# Patient Record
Sex: Female | Born: 1965 | Race: White | Hispanic: No | Marital: Married | Smoking: Former smoker
Health system: Southern US, Community
[De-identification: ages and names within clinical notes are randomized; demographics above are authoritative.]

## PROBLEM LIST (undated history)

## (undated) DIAGNOSIS — E785 Hyperlipidemia, unspecified: Secondary | ICD-10-CM

## (undated) DIAGNOSIS — Z720 Tobacco use: Secondary | ICD-10-CM

## (undated) DIAGNOSIS — T7840XA Allergy, unspecified, initial encounter: Secondary | ICD-10-CM

## (undated) DIAGNOSIS — IMO0002 Reserved for concepts with insufficient information to code with codable children: Secondary | ICD-10-CM

## (undated) DIAGNOSIS — R0789 Other chest pain: Secondary | ICD-10-CM

## (undated) DIAGNOSIS — D649 Anemia, unspecified: Secondary | ICD-10-CM

## (undated) HISTORY — DX: Anemia, unspecified: D64.9

## (undated) HISTORY — DX: Tobacco use: Z72.0

## (undated) HISTORY — DX: Reserved for concepts with insufficient information to code with codable children: IMO0002

## (undated) HISTORY — DX: Allergy, unspecified, initial encounter: T78.40XA

## (undated) HISTORY — DX: Other chest pain: R07.89

## (undated) HISTORY — DX: Hyperlipidemia, unspecified: E78.5

---

## 2001-05-29 ENCOUNTER — Emergency Department (HOSPITAL_COMMUNITY): Admission: EM | Admit: 2001-05-29 | Discharge: 2001-05-29 | Payer: Self-pay

## 2001-08-20 ENCOUNTER — Other Ambulatory Visit: Admission: RE | Admit: 2001-08-20 | Discharge: 2001-08-20 | Payer: Self-pay | Admitting: Obstetrics & Gynecology

## 2002-03-12 ENCOUNTER — Emergency Department (HOSPITAL_COMMUNITY): Admission: EM | Admit: 2002-03-12 | Discharge: 2002-03-12 | Payer: Self-pay | Admitting: Emergency Medicine

## 2007-10-07 HISTORY — PX: OTHER SURGICAL HISTORY: SHX169

## 2007-10-09 ENCOUNTER — Inpatient Hospital Stay (HOSPITAL_COMMUNITY): Admission: EM | Admit: 2007-10-09 | Discharge: 2007-10-10 | Payer: Self-pay | Admitting: Emergency Medicine

## 2009-06-24 ENCOUNTER — Emergency Department (HOSPITAL_COMMUNITY): Admission: EM | Admit: 2009-06-24 | Discharge: 2009-06-25 | Payer: Self-pay | Admitting: Emergency Medicine

## 2009-08-20 ENCOUNTER — Ambulatory Visit: Payer: Self-pay | Admitting: Internal Medicine

## 2009-08-20 ENCOUNTER — Observation Stay (HOSPITAL_COMMUNITY): Admission: EM | Admit: 2009-08-20 | Discharge: 2009-08-21 | Payer: Self-pay | Admitting: Emergency Medicine

## 2009-09-05 DIAGNOSIS — R0789 Other chest pain: Secondary | ICD-10-CM

## 2009-09-05 HISTORY — DX: Other chest pain: R07.89

## 2009-09-07 ENCOUNTER — Encounter (INDEPENDENT_AMBULATORY_CARE_PROVIDER_SITE_OTHER): Payer: Self-pay | Admitting: Internal Medicine

## 2009-09-07 ENCOUNTER — Ambulatory Visit: Payer: Self-pay | Admitting: Internal Medicine

## 2009-09-07 DIAGNOSIS — F17200 Nicotine dependence, unspecified, uncomplicated: Secondary | ICD-10-CM | POA: Insufficient documentation

## 2009-09-07 DIAGNOSIS — L259 Unspecified contact dermatitis, unspecified cause: Secondary | ICD-10-CM | POA: Insufficient documentation

## 2009-09-07 DIAGNOSIS — R0789 Other chest pain: Secondary | ICD-10-CM | POA: Insufficient documentation

## 2009-09-07 DIAGNOSIS — E785 Hyperlipidemia, unspecified: Secondary | ICD-10-CM | POA: Insufficient documentation

## 2009-09-07 LAB — HM MAMMOGRAPHY

## 2010-10-05 NOTE — Assessment & Plan Note (Signed)
Summary: HFU/PER SIDHU/CH   Vital Signs:  Patient profile:   45 year old female Height:      63 inches (160.02 cm) Weight:      144.1 pounds (65.50 kg) BMI:     25.62 Temp:     97.6 degrees F (36.44 degrees C) oral Pulse rate:   77 / minute BP sitting:   131 / 80  (right arm)  Vitals Entered By: Stanton Kidney Ditzler RN September 27, 2009 3:34 PM)  Nutrition Counseling: Patient's BMI is greater than 25 and therefore counseled on weight management options. Is Patient Diabetic? No Pain Assessment Patient in pain? no      Nutritional Status BMI of 25 - 29 = overweight Nutritional Status Detail appetite good  Have you ever been in a relationship where you felt threatened, hurt or afraid?denies   Does patient need assistance? Functional Status Self care Ambulation Normal Comments New HFU - needs refill on stop smoking prefers 1 box. From [place] - has brother dying from ca in [place].   Visit Type:  hopsital follow up Primary Care Provider:  Elby Showers MD   History of Present Illness: [name] is a 45 year old woman with no significant past medical history aside from family history positive for early MI and tobacco use who presented with chest pain recently and was admiited for 1 day in the hospital comes in [place] for a hospital follow up. No c/o of any chest pain while running or hurrying for things. She complains of intermittent rash on her extremities which comes and goes. 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 No other complaints at this time.   Depression History:      The patient denies a depressed mood most of the day and a diminished interest in her usual daily activities.         Preventive Screening-Counseling & Management  Alcohol-Tobacco     Alcohol drinks/day: 0     Smoking Status: quit     Smoking Cessation Counseling: 09/05/09  Caffeine-Diet-Exercise     Diet Counseling: not indicated; diet is assessed to be  healthy     Does Patient Exercise: no  Problems Prior to Update: None  Medications Prior to Update: 1)  None  Current Medications (verified): 1)  Chantix Starting Month Pak 0.5 Mg X 11 & 1 Mg X 42 Tabs (Varenicline Tartrate) .... Take As Instructed 2)  Hydrocortisone 1 % Crea (Hydrocortisone) .... Apply To Affected Area 3 Times A Day  Allergies (verified): No Known Drug Allergies  Past History:  Family History: Last updated: Sep 27, 2009 father died of unknown cancer mothet is still alive at 78 with breast cancer. brother diagnose dwith lung cancer recently  Social History: Last updated: 09/27/09 live with your husband works as an Sales executive Former Smoker  Risk Factors: Alcohol Use: 0 (September 27, 2009) Exercise: no (27-Sep-2009)  Risk Factors: Smoking Status: quit (2009-09-27)  Past Medical History: degenerative disc disease tobacco abuse  Past Surgical History: left arm fracture and repaired with oRIF  Family History: Reviewed history and no changes required. father died of unknown cancer mothet is still alive at 23 with breast cancer. brother diagnose dwith lung cancer recently  Social History: Reviewed history and no changes required. 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    Impression & Recommendations:  Problem # 1:  CHEST PAIN, ATYPICAL (ICD-786.59) She does not complain of any new chest pains. Her chest pain is not related to cardiac origin as its not reproducible  with exertion. Risk startification with smoking cessation and lipid management with goal LDL of 130 will be started. No further cardiac workup required at this time.  Problem # 2:  TOBACCO ABUSE (ICD-305.1) She is determined to stop smoking. I congratulated her and gave her a box of chantix to help with cessation. Her updated medication list for this problem includes:    Chantix Starting Month Pak 0.5 Mg X 11 & 1 Mg X 42 Tabs (Varenicline tartrate) .Marland Kitchen... Take as instructed  Problem # 3:  CONTACT DERMATITIS&OTHER ECZEMA DUE UNSPEC CAUSE (ICD-692.9) Patient complains of intermittent rash with itching. This could be 2/2 cat dander or chemicals at work that she is exposed to. I counselled her about avoiding all these possible triggers and use topical steroid cream if rash reappears. Her updated medication list for this problem includes:    Hydrocortisone 1 % Crea (Hydrocortisone) .Marland Kitchen... Apply to affected area 3 times a day  Problem # 4:  Preventive Health Care (ICD-V70.0) Deferred at this time for next month with a follow up appointment with PCP.  Complete Medication List: 1)  Chantix Starting Month Pak 0.5 Mg X 11 & 1 Mg X 42 Tabs (Varenicline tartrate) .... Take as instructed 2)  Hydrocortisone 1 % Crea (Hydrocortisone) .... Apply to affected area 3 times a day  Patient Instructions: 1)  Please schedule a follow-up appointment in 1 month. 2)  It is important that you exercise regularly at least 20 minutes 5 times a week. If you develop chest pain, have severe difficulty breathing, or feel very tired , stop exercising immediately and seek medical attention. 3)  You need to lose weight. Consider a lower calorie diet and regular exercise.  4)  Schedule your mammogram. 5)  Schedule a colonoscopy/sigmoidoscopy to help detect colon cancer. 6)  You need to have a Pap Smear to prevent cervical cancer. Prescriptions: HYDROCORTISONE 1 % CREA (HYDROCORTISONE) apply to affected area 3 times a day  #1 x  1   Entered and Authorized by:   Lars Mage MD   Signed by:   Lars Mage MD on 09/07/2009   Method used:   Electronically to        [facility] [address] [phone]* (retail)       [address]       [place], [place]  [address]       Ph: [phone] or [phone]       Fax: [phone]   RxID:   [id number] CHANTIX STARTING MONTH PAK 0.5 MG X 11 & 1 MG X 42 TABS (VARENICLINE TARTRATE) take as instructed  #1 pack x 1   Entered and Authorized by:   Lars Mage MD   Signed by:   Lars Mage MD on 09/07/2009   Method used:   [facility]ically to        [facility] [address] [phone]* (retail)  [address]       [place], [place]  [address]       Ph: [phone] or [phone]       Fax: [phone]   RxID:   [id number]    Prevention & Chronic Care Immunizations   Influenza vaccine: Not documented    Tetanus booster: Not documented   Td booster deferral: Deferred  (09/07/2009)    Pneumococcal vaccine: Not documented  Other Screening   Pap smear: Not documented   Pap smear action/deferral: Deferred  (09/07/2009)    Mammogram: Not documented   Mammogram action/deferral: Deferred  (09/07/2009)   Smoking status: quit  (09/07/2009)  Lipids   Total Cholesterol: Not documented   Lipid panel action/deferral: Not indicated   LDL: Not documented   LDL Direct: Not documented   HDL: Not documented   Triglycerides: Not documented   Lipid panel due: 11/18/2009    SGOT (AST): Not documented   SGPT (ALT): Not documented   Alkaline phosphatase: Not documented   Total bilirubin: Not documented    Lipid flowsheet reviewed?: Yes   Progress toward LDL goal: At goal  Self-Management Support :   Personal Goals (by the next clinic visit) :      Personal LDL goal: 130  (09/07/2009)    Lipid self-management support: Not documented     Lipid self-management support not done because: Good outcomes  (09/07/2009)   Nursing  Instructions: Give Flu vaccine today

## 2010-10-05 NOTE — Miscellaneous (Signed)
Summary: HIPAA Restrictions  HIPAA Restrictions   Imported By: Florinda Marker 09/07/2009 16:32:10  _____________________________________________________________________  External Attachment:    Type:   Image     Comment:   External Document

## 2010-11-09 ENCOUNTER — Encounter: Payer: Self-pay | Admitting: Ophthalmology

## 2010-12-06 LAB — HEPATIC FUNCTION PANEL
Alkaline Phosphatase: 58 U/L (ref 39–117)
Bilirubin, Direct: <0.1 mg/dL (ref 0.0–0.3)
Total Protein: 6.2 g/dL (ref 6.0–8.3)

## 2010-12-06 LAB — CBC
HCT: 38.8 % (ref 36.0–46.0)
MCHC: 35.4 g/dL (ref 30.0–36.0)
MCV: 93.5 fL (ref 78.0–100.0)
Platelets: 416 10*3/uL — ABNORMAL HIGH (ref 150–400)

## 2010-12-06 LAB — POCT CARDIAC MARKERS
CKMB, poc: <1 ng/mL — ABNORMAL LOW (ref 1.0–8.0)
CKMB, poc: <1 ng/mL — ABNORMAL LOW (ref 1.0–8.0)
Myoglobin, poc: 53.6 ng/mL (ref 12–200)

## 2010-12-06 LAB — BASIC METABOLIC PANEL
BUN: 13 mg/dL (ref 6–23)
BUN: 9 mg/dL (ref 6–23)
CO2: 24 mEq/L (ref 19–32)
Calcium: 8.9 mg/dL (ref 8.4–10.5)
Calcium: 9.2 mg/dL (ref 8.4–10.5)
Creatinine, Ser: 0.58 mg/dL (ref 0.4–1.2)
Creatinine, Ser: 0.68 mg/dL (ref 0.4–1.2)
GFR calc non Af Amer: >60 mL/min (ref >60)
Glucose, Bld: 118 mg/dL — ABNORMAL HIGH (ref 70–99)
Glucose, Bld: 93 mg/dL (ref 70–99)
Potassium: 4.1 mEq/L (ref 3.5–5.1)

## 2010-12-06 LAB — DIFFERENTIAL
Basophils Relative: 1 % (ref 0–1)
Eosinophils Absolute: 0.2 10*3/uL (ref 0.0–0.7)
Eosinophils Relative: 2 % (ref 0–5)
Neutrophils Relative %: 52 % (ref 43–77)

## 2010-12-06 LAB — CARDIAC PANEL(CRET KIN+CKTOT+MB+TROPI)
CK, MB: 0.7 ng/mL (ref 0.3–4.0)
CK, MB: 0.7 ng/mL (ref 0.3–4.0)
Total CK: 39 U/L (ref 7–177)

## 2010-12-06 LAB — LIPID PANEL

## 2010-12-06 LAB — TROPONIN I

## 2010-12-06 LAB — CK TOTAL AND CKMB (NOT AT ARMC)
CK, MB: 0.6 ng/mL (ref 0.3–4.0)
Total CK: 34 U/L (ref 7–177)

## 2010-12-06 LAB — HEMOGLOBIN A1C

## 2011-01-18 NOTE — Op Note (Signed)
[name], [name]            ACCOUNT NO.:  [id number]   MEDICAL RECORD NO.:  [id number]          PATIENT TYPE:  OBV   LOCATION:  5015                         FACILITY:  [facility]   PHYSICIAN:  Wanda Ano. Gramig III, M.D.DATE OF BIRTH:  August 13, 1966   DATE OF PROCEDURE:  10/09/2007  DATE OF DISCHARGE:                               OPERATIVE REPORT   PREOPERATIVE DIAGNOSIS:  Elbow fracture subluxation with comminuted  radial head fracture and lateral collateral ligament insufficiency.   POSTOPERATIVE DIAGNOSIS:  Elbow fracture subluxation with comminuted  radial head fracture and lateral collateral ligament insufficiency with  associated capitellum loose body (osteochondral fracture of the  capitellum).   SURGICAL PROCEDURES:  1. Open reduction internal fixation radial head fracture with      reduction of the subluxation.  2. Lateral ulnar collateral ligament repair with FiberWire technique      left elbow.  3. Loose body removal secondary to osteochondral fracture about the      capitellum with capitellum debridement.  4. Stress radiography.   SURGEON:  [name], M.D.   ASSISTANT:  Wanda Chimera, PA   COMPLICATIONS:  None.   ANESTHESIA:  General.   TOURNIQUET TIME:  Less than an hour.   ESTIMATED BLOOD LOSS:  Minimal.   INDICATIONS FOR PROCEDURE:  This patient is a 45 year old female who  presents with above mentioned diagnosis.  I have counseled her in regard  to risks benefits surgery including risk of infection, bleeding,  anesthesia, damage to normal structures and failure of surgery to  accomplish its intended goals of relieving symptoms and restoring  function.  With this in mind, she desires to proceed.  All questions  encouraged answered preoperatively.   OPERATIVE PROCEDURE:  The patient seen by myself and anesthesia taken to  the operative suite, underwent smooth induction of IV sedation and  infraclavicular block placed by Dr. Isidor Kelly in  the holding area  had excellent anesthetic abilities.  The patient had the permit signed,  time out called and arm marked prior to procedure.  Once she was prepped  and draped usual sterile fashion a lateral incision was made about the  elbow.  Dissection was carried down and interval between the anconeus  and extensor carpi ulnaris was accomplished.  The lateral collateral  ligament was injured and there was an osteochondral fracture as well as  comminuted radial head fracture.  I irrigated the joint, cleaned out the  hematoma and checked her stability and findings.  Subluxation was  reduced.  The radial head then underwent reduction and reconstruction  with AccuTrack screws of the micro variety.  This allowed for excellent  recreation of the joint and I was pleased with the ORIF the radial head  and its subsequent and reduction of the subluxation.   Following this, I removed the loose body from the capitellum.  This was  an osteochondral shear fracture and was removed without difficulty and  debrided.  Following this, I irrigated the area copiously once again and  turned attention towards the ligamentous architecture.   The lateral collateral ligament was repaired with FiberWire technique.  This done without difficulty imbricated it nicely.  The patient had  excellent stability, full range of motion, no complicating features.   Following this, I performed stress radiography revealing excellent  position on the AP and lateral planes, also performed stress radiography  of the wrist which did not show any fracture.  The scaphoid and  scapholunate interval looked well.  I was pleased with this and the  findings.  Following this I irrigated copiously, closed the fascia with  0-0 Vicryl, subcu with 3-0 Vicryl.  The skin edge with 3-0 Prolene.  The  patient tolerated this well.  Marcaine 0.25% with epinephrine was placed  for postop analgesia and she was placed a long-arm splint neutral   position.  She will be taken to recovery room and will plan for IV  antibiotics, general postop observation, IV pain medicine etc. as  necessary.  All questions have been encouraged answered.           ______________________________  [name], M.D.     [name]  D:  10/09/2007  T:  10/10/2007  Job:  [id number]

## 2011-05-27 LAB — BASIC METABOLIC PANEL
CO2: 22
Calcium: 9.4
Chloride: 106
Glucose, Bld: 119 — ABNORMAL HIGH
Potassium: 3.9
Sodium: 136

## 2011-05-27 LAB — DIFFERENTIAL
Basophils Relative: 0
Eosinophils Absolute: 0.1
Lymphs Abs: 1.6
Monocytes Absolute: 0.6
Monocytes Relative: 6
Neutrophils Relative %: 78 — ABNORMAL HIGH

## 2011-05-27 LAB — CBC
Hemoglobin: 12.8
MCHC: 34.8
MCV: 91.6
RBC: 4.02
WBC: 10.8 — ABNORMAL HIGH

## 2011-10-19 ENCOUNTER — Ambulatory Visit (INDEPENDENT_AMBULATORY_CARE_PROVIDER_SITE_OTHER): Payer: 59 | Admitting: Emergency Medicine

## 2011-10-19 ENCOUNTER — Ambulatory Visit: Payer: 59

## 2011-10-19 VITALS — BP 167/98 | HR 79 | Temp 98.1°F | Resp 18 | Ht 62.0 in | Wt 181.2 lb

## 2011-10-19 DIAGNOSIS — M549 Dorsalgia, unspecified: Secondary | ICD-10-CM

## 2011-10-19 DIAGNOSIS — N920 Excessive and frequent menstruation with regular cycle: Secondary | ICD-10-CM

## 2011-10-19 DIAGNOSIS — R05 Cough: Secondary | ICD-10-CM

## 2011-10-19 DIAGNOSIS — R059 Cough, unspecified: Secondary | ICD-10-CM

## 2011-10-19 DIAGNOSIS — J40 Bronchitis, not specified as acute or chronic: Secondary | ICD-10-CM

## 2011-10-19 DIAGNOSIS — D649 Anemia, unspecified: Secondary | ICD-10-CM

## 2011-10-19 DIAGNOSIS — R509 Fever, unspecified: Secondary | ICD-10-CM

## 2011-10-19 LAB — POCT CBC
Granulocyte percent: 57.1 %G (ref 37–80)
Hemoglobin: 10.5 g/dL — AB (ref 12.2–16.2)
Lymph, poc: 2.5 (ref 0.6–3.4)
MCHC: 32.2 g/dL (ref 31.8–35.4)
MPV: 10.1 fL (ref 0–99.8)
POC Granulocyte: 4 (ref 2–6.9)
POC LYMPH PERCENT: 35.6 %L (ref 10–50)
POC MID %: 7.3 %M (ref 0–12)
RDW, POC: 32.2 %

## 2011-10-19 MED ORDER — BENZONATATE 200 MG PO CAPS: 200.0000 mg | ORAL_CAPSULE | Freq: Three times a day (TID) | ORAL | Status: AC | PRN

## 2011-10-19 MED ORDER — AZITHROMYCIN 250 MG PO TABS: ORAL_TABLET | ORAL | Status: AC

## 2011-10-19 NOTE — Progress Notes (Signed)
  Subjective:    Patient ID: [name], female    DOB: 1965-10-07, 46 y.o.   MRN: [id number]  HPI patient enters with about 3-4 day history of cough he has had discomfort in her lower chest posteriorly on both sides. She has had a cough which has been productive of small amounts of phlegm. She is unsure whether she has had a fever appear   Review of Systems patient denies chest pain. She has no history of bronchitis or previous lung disease. She quit smoking many many years ago.     Objective:   Physical Exam physical exam her HEENT exam is within normal limits. Her chest was clear to auscultation her heart was regular rate no murmurs her abdomen was soft without tenderness.  [facility] reading (PRIMARY) by  Dr. Cleta Alberts NAD.        Assessment & Plan:  Patient has an acute bronchitis. She also was found to be anemic and will need to refer her to GYN for menorrhagia for that problem. We'll treat with Tessalon Perles and a Z-Pak and see if we can improve her symptoms. I gave her a work note to leave her out of work the rest of the week and return to work next Monday.

## 2011-10-24 ENCOUNTER — Encounter: Payer: Self-pay | Admitting: Gynecology

## 2011-10-24 ENCOUNTER — Ambulatory Visit (INDEPENDENT_AMBULATORY_CARE_PROVIDER_SITE_OTHER): Payer: Self-pay | Admitting: Gynecology

## 2011-10-24 ENCOUNTER — Other Ambulatory Visit (HOSPITAL_COMMUNITY)
Admission: RE | Admit: 2011-10-24 | Discharge: 2011-10-24 | Disposition: A | Discharge status: Home / Self Care | Payer: Self-pay | Source: Ambulatory Visit | Attending: Gynecology | Admitting: Gynecology

## 2011-10-24 VITALS — BP 146/92 | Ht 62.0 in | Wt 180.0 lb

## 2011-10-24 DIAGNOSIS — Z01419 Encounter for gynecological examination (general) (routine) without abnormal findings: Secondary | ICD-10-CM | POA: Insufficient documentation

## 2011-10-24 DIAGNOSIS — Z833 Family history of diabetes mellitus: Secondary | ICD-10-CM

## 2011-10-24 DIAGNOSIS — N979 Female infertility, unspecified: Secondary | ICD-10-CM | POA: Insufficient documentation

## 2011-10-24 LAB — URINALYSIS W MICROSCOPIC + REFLEX CULTURE
Bilirubin Urine: NEGATIVE
Glucose, UA: NEGATIVE mg/dL
Hgb urine dipstick: NEGATIVE
Leukocytes, UA: NEGATIVE
pH: 5 (ref 5.0–8.0)

## 2011-10-24 LAB — CBC WITH DIFFERENTIAL/PLATELET
Eosinophils Relative: 2 % (ref 0–5)
HCT: 39.3 % (ref 36.0–46.0)
Hemoglobin: 13.1 g/dL (ref 12.0–15.0)
Lymphocytes Relative: 33 % (ref 12–46)
MCHC: 33.3 g/dL (ref 30.0–36.0)
MCV: 91.8 fL (ref 78.0–100.0)
Monocytes Absolute: 0.5 10*3/uL (ref 0.1–1.0)
Monocytes Relative: 6 % (ref 3–12)
Neutro Abs: 5 10*3/uL (ref 1.7–7.7)

## 2011-10-24 LAB — GLUCOSE, RANDOM: Glucose, Bld: 96 mg/dL (ref 70–99)

## 2011-10-24 NOTE — Progress Notes (Signed)
[name] Mar 22, 1966 [id number]   History:    46 y.o.  for annual exam who is a gravida 1 para 0 AB 1 who has not had any gynecological exam several years. She has no primary physician. She was evaluated in the emergency room 2010 for chest pain. Patient is asymptomatic today. Not using any form of contraception. 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 Patient denies minimal infertility evaluation and the past. No prior mammograms done. Patient does her monthly self breast examination. Regular menstrual cycles are reported although sometimes heavy but have been regular lasting 3 days.  Past medical history,surgical history, family history and social history were all reviewed and documented in the EPIC chart.  Gynecologic History Patient's last menstrual period was 10/19/2011. Contraception: none Last Pap: ?Marland Kitchen Results were: normal Last mammogram:  No prior study. Results were: no prior study  Obstetric History OB History    Grav Para Term Preterm Abortions TAB SAB Ect Mult Living                   ROS:  Was performed and pertinent positives and negatives are included in the history.  Exam: chaperone present  BP 146/92  Ht 5\' 2"  (1.575 m)  Wt 180 lb (81.647 kg)  BMI 32.92 kg/m2  LMP 10/19/2011  Body mass index is 32.92 kg/(m^2).  General appearance : Well developed well nourished female. No acute distress HEENT: Neck supple, trachea midline, no carotid bruits, no thyroidmegaly Lungs: Clear to auscultation, no rhonchi or wheezes, or rib retractions  Heart: Regular rate and rhythm, no murmurs or gallops Breast:Examined in sitting and supine position were symmetrical in appearance, no palpable masses or tenderness,  no skin retraction, no nipple inversion, no nipple discharge, no skin discoloration, no axillary or supraclavicular lymphadenopathy Abdomen: no palpable masses or tenderness, no rebound or guarding Extremities: no edema or skin discoloration or  tenderness  Pelvic:  Bartholin, Urethra, Skene Glands: Within normal limits             Vagina: No gross lesions or discharge  Cervix: No gross lesions or discharge  Uterus  anteverted, normal size, shape and consistency, non-tender and mobile  Adnexa  Without masses or tenderness  Anus and perineum  normal   Rectovaginal  normal sphincter tone without palpated masses or tenderness             Hemoccult  not done     Assessment/Plan:  46 y.o. female for annual exam  with no complaints. Repeat blood pressure 130/90. Patient be given a requisition to schedule her mammogram. We're going to ask her to maintain a blood pressure log at home and submit to the office after a week of readings. She will have the following labs drawn today: CBC, total cholesterol, random blood sugar, urinalysis along with her Pap smear. We did discuss her secondary infertility. Patient not interested in proceeding with any additional testing. Patient instructed take calcium and vitamin D for osteoporosis prevention.   Ok Edwards MD, 2:56 PM 10/24/2011

## 2011-10-24 NOTE — Patient Instructions (Signed)
I would recommend that you purchase in the pharmacy and ovulation predictor kit and use it from day 12 through 16 of her cycle. If the strip shows a change this would mean that she would be ovulating in the next 24 hours and this would be the time to have intercourse. I would also recommend that she takes daily prenatal vitamin that she can buy over-the-counter. Remember to schedule mammogram.

## 2012-01-09 ENCOUNTER — Encounter: Payer: Self-pay | Admitting: *Deleted

## 2012-01-12 ENCOUNTER — Other Ambulatory Visit: Payer: Self-pay | Admitting: *Deleted

## 2012-01-12 DIAGNOSIS — D691 Qualitative platelet defects: Secondary | ICD-10-CM

## 2012-01-12 DIAGNOSIS — E7800 Pure hypercholesterolemia, unspecified: Secondary | ICD-10-CM

## 2012-01-17 ENCOUNTER — Other Ambulatory Visit: Payer: 59

## 2012-01-17 DIAGNOSIS — D691 Qualitative platelet defects: Secondary | ICD-10-CM

## 2012-01-17 DIAGNOSIS — E7800 Pure hypercholesterolemia, unspecified: Secondary | ICD-10-CM

## 2012-01-17 LAB — CBC WITH DIFFERENTIAL/PLATELET
Basophils Absolute: 0 10*3/uL (ref 0.0–0.1)
Basophils Relative: 0 % (ref 0–1)
Eosinophils Absolute: 0.1 10*3/uL (ref 0.0–0.7)
Eosinophils Relative: 2 % (ref 0–5)
MCH: 30.5 pg (ref 26.0–34.0)
MCHC: 33.2 g/dL (ref 30.0–36.0)
MCV: 91.8 fL (ref 78.0–100.0)
Platelets: 387 10*3/uL (ref 150–400)
RDW: 12.3 % (ref 11.5–15.5)

## 2012-01-17 LAB — LIPID PANEL
Cholesterol: 240 mg/dL — ABNORMAL HIGH (ref 0–200)
HDL: 41 mg/dL (ref >39)
Total CHOL/HDL Ratio: 5.9 Ratio

## 2013-07-15 ENCOUNTER — Encounter (HOSPITAL_COMMUNITY): Payer: Self-pay | Admitting: Emergency Medicine

## 2013-07-15 ENCOUNTER — Emergency Department (HOSPITAL_COMMUNITY): Payer: 59

## 2013-07-15 ENCOUNTER — Emergency Department (HOSPITAL_COMMUNITY)
Admission: EM | Admit: 2013-07-15 | Discharge: 2013-07-16 | Disposition: A | Discharge status: Home / Self Care | Payer: 59 | Attending: Emergency Medicine | Admitting: Emergency Medicine

## 2013-07-15 DIAGNOSIS — Z8679 Personal history of other diseases of the circulatory system: Secondary | ICD-10-CM | POA: Insufficient documentation

## 2013-07-15 DIAGNOSIS — J069 Acute upper respiratory infection, unspecified: Secondary | ICD-10-CM

## 2013-07-15 DIAGNOSIS — B9789 Other viral agents as the cause of diseases classified elsewhere: Secondary | ICD-10-CM | POA: Insufficient documentation

## 2013-07-15 DIAGNOSIS — Z79899 Other long term (current) drug therapy: Secondary | ICD-10-CM | POA: Insufficient documentation

## 2013-07-15 DIAGNOSIS — H9209 Otalgia, unspecified ear: Secondary | ICD-10-CM | POA: Insufficient documentation

## 2013-07-15 DIAGNOSIS — Z87891 Personal history of nicotine dependence: Secondary | ICD-10-CM | POA: Insufficient documentation

## 2013-07-15 DIAGNOSIS — IMO0002 Reserved for concepts with insufficient information to code with codable children: Secondary | ICD-10-CM | POA: Insufficient documentation

## 2013-07-15 DIAGNOSIS — B349 Viral infection, unspecified: Secondary | ICD-10-CM

## 2013-07-15 DIAGNOSIS — R0602 Shortness of breath: Secondary | ICD-10-CM | POA: Insufficient documentation

## 2013-07-15 DIAGNOSIS — E785 Hyperlipidemia, unspecified: Secondary | ICD-10-CM | POA: Insufficient documentation

## 2013-07-15 DIAGNOSIS — R110 Nausea: Secondary | ICD-10-CM | POA: Insufficient documentation

## 2013-07-15 DIAGNOSIS — R6883 Chills (without fever): Secondary | ICD-10-CM | POA: Insufficient documentation

## 2013-07-15 NOTE — ED Notes (Signed)
Pt states URI like smyptoms for the past month. Pt states dry cough with intermittant productivity. Pt states that she has had stuffy nose on right side and nausea. Pt ambulatory, no respiratory distress.

## 2013-07-16 MED ORDER — ALBUTEROL SULFATE HFA 108 (90 BASE) MCG/ACT IN AERS
2.0000 | INHALATION_SPRAY | Freq: Once | RESPIRATORY_TRACT | Status: AC
  Administered 2013-07-16: 2 via RESPIRATORY_TRACT
  Filled 2013-07-16: qty 6.7

## 2013-07-16 NOTE — ED Provider Notes (Signed)
CSN: [id number]     Arrival date & time 07/15/13  2111 History   First MD Initiated Contact with Patient 07/15/13 2212     Chief Complaint  Patient presents with  . URI   (Consider location/radiation/quality/duration/timing/severity/associated sxs/prior Treatment) The history is provided by the patient. No language interpreter was used.  [name] is a 47 year old female with past medical history of hyperlipidemia, atypical chest pain, degenerative disc disease presenting to emergency department with sinus pressure, cough, nasal congestion. Patient reports that the sinus pressure discomfort has been ongoing for the past month. Patient reports that the cough has been ongoing for the past month. Patient reports that she's been developing phlegm with a yellow discoloration. Patient reports she's been experiencing right ear pain described as a dull aching sensation. Patient reports that every once in a while she gets shortness of breath-reported that this is nothing new and that this is been ongoing for the past couple of years. Patient reports that her sinus discomfort improves with hot compressions to the sinus regions. Discussed that she has been using Claritin as needed for relief. Denied sore throat, fever, chills, vomiting, diarrhea, abdominal pain, weakness, changes to eating, sore throat, difficulty swallowing, urinary issues. PCP none  Past Medical History  Diagnosis Date  . Degenerative disk disease     No radiologic evicence in EMR  . Tobacco abuse   . Hyperlipidemia   . Atypical chest pain 1/11    Pain is not reproducible with exertion.    Past Surgical History  Procedure Laterality Date  . Other orif  2/09     Fracture of the radial head with  intraarticular extension and joint.   Family History  Problem Relation Age of Onset  . Breast cancer Mother   . Cancer Father     Unknown type.  . Cancer Brother     LUNG   History  Substance Use Topics  . Smoking  status: Former Smoker    Quit date: 10/23/2009  . Smokeless tobacco: Never Used  . Alcohol Use: Yes     Comment: RARE   OB History   Grav Para Term Preterm Abortions TAB SAB Ect Mult Living                 Review of Systems  Constitutional: Positive for chills. Negative for fever.  HENT: Positive for congestion and sinus pressure. Negative for trouble swallowing.   Eyes: Negative for visual disturbance.  Respiratory: Positive for cough and shortness of breath. Negative for chest tightness.   Cardiovascular: Negative for chest pain.  Gastrointestinal: Positive for nausea. Negative for vomiting and abdominal pain.  Musculoskeletal: Negative for back pain.  Neurological: Negative for dizziness, weakness and headaches.  All other systems reviewed and are negative.    Allergies  Milk-related compounds  Home Medications   Current Outpatient Rx  Name  Route  Sig  Dispense  Refill  . ibuprofen (ADVIL,MOTRIN) 200 MG tablet   Oral   Take 600-800 mg by mouth daily as needed (pain).         Marland Kitchen loratadine (CLARITIN) 10 MG tablet   Oral   Take 10 mg by mouth daily.         Marland Kitchen triamcinolone (NASACORT ALLERGY 24HR) 55 MCG/ACT AERO nasal inhaler   Nasal   Place 2 sprays into the nose daily as needed (congestion).          BP 176/89  Pulse 88  Temp(Src) 98.1 F (36.7 C) (Oral)  Resp 16  Ht 5\' 3"  (1.6 m)  Wt 189 lb 9 oz (85.985 kg)  BMI 33.59 kg/m2  SpO2 97%  LMP 06/26/2013 Physical Exam  Nursing note and vitals reviewed. Constitutional: She is oriented to person, place, and time. She appears well-developed and well-nourished. No distress.  HENT:  Head: Normocephalic and atraumatic.  Right Ear: External ear normal.  Left Ear: External ear normal.  Mouth/Throat: Oropharynx is clear and moist. No oropharyngeal exudate.  Eyes: Conjunctivae and EOM are normal. Pupils are equal, round, and reactive to light. Right eye exhibits no discharge. Left eye exhibits no discharge.   Neck: Normal range of motion. Neck supple.  Cardiovascular: Normal rate, regular rhythm and normal heart sounds.  Exam reveals no friction rub.   No murmur heard. Pulses:      Radial pulses are 2+ on the right side, and 2+ on the left side.  Pulmonary/Chest: Effort normal and breath sounds normal. No respiratory distress. She has no wheezes. She has no rales.  Neurological: She is alert and oriented to person, place, and time.  Skin: Skin is warm and dry. No rash noted. She is not diaphoretic. No erythema.  Psychiatric: She has a normal mood and affect. Her behavior is normal. Thought content normal.    ED Course  Procedures (including critical care time) Labs Review Labs Reviewed - No data to display Imaging Review Dg Chest 2 View  07/15/2013   CLINICAL DATA:  Cough and shortness of breath.  EXAM: CHEST  2 VIEW  COMPARISON:  10/19/2011.  FINDINGS: Normal sized heart. Clear lungs. Mild thoracic spine degenerative changes.  IMPRESSION: No acute abnormality.   Electronically Signed   By: Gordan Payment M.D.   On: 07/15/2013 23:23    EKG Interpretation   None       MDM   1. URI (upper respiratory infection)   2. Viral illness    Filed Vitals:   07/15/13 2119  BP: 176/89  Pulse: 88  Temp: 98.1 F (36.7 C)  TempSrc: Oral  Resp: 16  Height: 5\' 3"  (1.6 m)  Weight: 189 lb 9 oz (85.985 kg)  SpO2: 97%   Patient presenting to emergency part with cough, nasal congestion, shortness of breath been ongoing for the past week. Patient reports she's been having on and off sinus pressure is been ongoing for the past month. Alert and oriented. Lungs clear to auscultation bilaterally to upper and lower lobes. Heart rate and rhythm normal. Pulses palpable and strong, radial 2+ bilaterally. Negative pain upon palpation to the frontal and maxillary sinuses. Oral exam unremarkable. Eye and ear exam unremarkable. Chest x-ray negative for pneumonia or consolidation, infiltrates negative  findings. Doubt pneumonia. Low PERC score doubt PE. Suspicion to be possible bronchitis. Upper respiratory infection-viral versus allergic. Patient stable, afebrile. Discharge patient with albuterol inhaler. Discussed with patient to continue to take allergy medication prescribed. Discussed with patient to continue to use hot compressions in size relief. Discussed with patient to closely monitor symptoms and if symptoms are to worsen or change report back to emergency department-strict return instructions given. Patient agreed to plan of care, understood, all questions answered.    Raymon Mutton, PA-C 07/17/13 2216

## 2013-07-18 NOTE — ED Provider Notes (Signed)
Medical screening examination/treatment/procedure(s) were performed by non-physician practitioner and as supervising physician I was immediately available for consultation/collaboration.  EKG Interpretation   None        Juliet Rude. Rubin Payor, MD 07/18/13 0730

## 2013-09-28 ENCOUNTER — Ambulatory Visit (INDEPENDENT_AMBULATORY_CARE_PROVIDER_SITE_OTHER): Payer: 59 | Admitting: Family Medicine

## 2013-09-28 ENCOUNTER — Ambulatory Visit: Payer: 59

## 2013-09-28 VITALS — BP 110/80 | HR 85 | Temp 98.2°F | Resp 19

## 2013-09-28 DIAGNOSIS — J45909 Unspecified asthma, uncomplicated: Secondary | ICD-10-CM

## 2013-09-28 DIAGNOSIS — R059 Cough, unspecified: Secondary | ICD-10-CM

## 2013-09-28 DIAGNOSIS — N393 Stress incontinence (female) (male): Secondary | ICD-10-CM

## 2013-09-28 DIAGNOSIS — J069 Acute upper respiratory infection, unspecified: Secondary | ICD-10-CM

## 2013-09-28 DIAGNOSIS — R05 Cough: Secondary | ICD-10-CM

## 2013-09-28 LAB — POCT CBC
GRANULOCYTE PERCENT: 59.4 % (ref 37–80)
HCT, POC: 39.4 % (ref 37.7–47.9)
HEMOGLOBIN: 12.5 g/dL (ref 12.2–16.2)
LYMPH, POC: 2.6 (ref 0.6–3.4)
MCH, POC: 30.6 pg (ref 27–31.2)
MCHC: 31.7 g/dL — AB (ref 31.8–35.4)
MCV: 96.5 fL (ref 80–97)
MID (cbc): 0.6 (ref 0–0.9)
MPV: 10.1 fL (ref 0–99.8)
POC GRANULOCYTE: 4.6 (ref 2–6.9)
POC LYMPH PERCENT: 33.5 %L (ref 10–50)
POC MID %: 7.1 %M (ref 0–12)
Platelet Count, POC: 311 10*3/uL (ref 142–424)
RBC: 4.08 M/uL (ref 4.04–5.48)
RDW, POC: 12 %
WBC: 7.8 10*3/uL (ref 4.6–10.2)

## 2013-09-28 MED ORDER — ALBUTEROL SULFATE (2.5 MG/3ML) 0.083% IN NEBU
2.5000 mg | INHALATION_SOLUTION | Freq: Once | RESPIRATORY_TRACT | Status: AC
  Administered 2013-09-28: 2.5 mg via RESPIRATORY_TRACT

## 2013-09-28 MED ORDER — BUDESONIDE-FORMOTEROL FUMARATE 80-4.5 MCG/ACT IN AERO: 2.0000 | INHALATION_SPRAY | Freq: Two times a day (BID) | RESPIRATORY_TRACT | Status: DC

## 2013-09-28 MED ORDER — ALBUTEROL SULFATE HFA 108 (90 BASE) MCG/ACT IN AERS: 2.0000 | INHALATION_SPRAY | Freq: Four times a day (QID) | RESPIRATORY_TRACT | Status: DC | PRN

## 2013-09-28 MED ORDER — PREDNISONE 20 MG PO TABS: ORAL_TABLET | ORAL | Status: DC

## 2013-09-28 NOTE — Patient Instructions (Signed)
Take the prednisone in a tapered dose fashion as directed, 3 pills daily for 2 days, then 2 daily for 2 days, then one daily for 2 days. Take after breakfast.  Use your albuterol inhaler 2 puffs 4 times daily as directed for about 4 or 5 days, then on an as-needed basis.  If you're not doing much better by 7-10 days from now you should come in for recheck.  Next Wednesday again using the Symbicort inhaler 2 inhalations twice daily morning and evening. This should be continued for at least one month. You probably need to be on it longer term. If you are feeling like you're doing very well after a month, decrease it to one inhalation twice daily, and after a couple of weeks down to one inhalation daily. If you discontinue it and symptoms are starting to come back return for recheck.

## 2013-09-28 NOTE — Progress Notes (Signed)
Subjective: 48 year old lady who is here with a three-month history of cough. Does not smoke. She coughs day and night. She has no history of asthma. Her her husband healthy. She is not coughing up much stuff. She does have a nasal congestion and blowing stuff out of her nose. She has had a little discomfort in her years. She has a lot of problems with bladder incontinence from a heart coughing.  Objective: Pleasant she a lot. TMs are normal. Has nasal stuffiness. Throat clear. Neck supple without nodes. Chest has poor air exchange. No rhonchi, rales, wheezes. Peak flow 350 predicted 410. Nobody  Assessment: Chronic cough Upper respiratory congestion Stress urinary incontinence Hypertension  [facility] reading (PRIMARY) by  Dr. Alwyn RenHopper No acute infitrate.  Normal ches.  Plan: See instructions. Patient instructed on how to use her inhalers. Return if not improving.   .Marland Kitchen

## 2015-11-08 ENCOUNTER — Emergency Department (HOSPITAL_COMMUNITY)
Admission: EM | Admit: 2015-11-08 | Discharge: 2015-11-08 | Disposition: A | Discharge status: Home / Self Care | Payer: 59 | Source: Home / Self Care | Attending: Family Medicine | Admitting: Family Medicine

## 2015-11-08 DIAGNOSIS — L509 Urticaria, unspecified: Secondary | ICD-10-CM

## 2015-11-08 DIAGNOSIS — J45909 Unspecified asthma, uncomplicated: Secondary | ICD-10-CM | POA: Diagnosis not present

## 2015-11-08 MED ORDER — PREDNISONE 20 MG PO TABS: ORAL_TABLET | ORAL | Status: DC

## 2015-11-08 MED ORDER — METHYLPREDNISOLONE ACETATE 80 MG/ML IJ SUSP
80.0000 mg | Freq: Once | INTRAMUSCULAR | Status: AC
  Administered 2015-11-08: 80 mg via INTRAMUSCULAR

## 2015-11-08 MED ORDER — METHYLPREDNISOLONE ACETATE 80 MG/ML IJ SUSP
INTRAMUSCULAR | Status: AC
  Filled 2015-11-08: qty 1

## 2015-11-08 MED ORDER — HYDROXYZINE HCL 25 MG PO TABS: 25.0000 mg | ORAL_TABLET | Freq: Three times a day (TID) | ORAL | Status: DC | PRN

## 2015-11-08 NOTE — Discharge Instructions (Signed)
It is a pleasure to see you today in the Urgent Care Center.   You received an injection of a steroid, SoluMedrol 80mg  intramuscularly.   I recommend using the Prednisone 20mg  tablets, take 3 tablets by mouth once daily for seven days, beginning tomorrow morning.   Hydroxyzine 25mg  tablet, take 1 tablet by mouth every 8 hours as needed for itch. Do not take with Benadryl.  This medicine (hydroxyzine) may cause drowsiness.  Do not take with alcohol.  If the swelling gets worse or if you begin with respiratory symptoms, return to the Urgent Care Center or the Emergency Department.

## 2015-11-08 NOTE — ED Provider Notes (Signed)
CSN: [id number]     Arrival date & time 11/08/15  1806 History   First MD Initiated Contact with Patient 11/08/15 1946     Chief Complaint  Patient presents with  . Urticaria   (Consider location/radiation/quality/duration/timing/severity/associated sxs/prior Treatment) Patient is a 50 y.o. female presenting with urticaria. The history is provided by the patient. No language interpreter was used.  Urticaria Pertinent negatives include no chest pain and no shortness of breath.  Patient presents with one week of evolving itchy rash over body, upper chest, face, arms and legs.  Started after awakening in the night after what she believes was a spider bite on R wrist; she believes the spider (which she did not see) "laid eggs in the bite", which she reports having removed.  Began with welts after that event.  Believes her housecats had fleas, so she had the house bombed for fleas and the cats treated.  Acceleration of the development of the welts after the pest treatment.  Changed her fabric softener, which did not help, so she recently changed to hypoallergenic fabric softener. No other new hygiene or household cleaners, no new medications.  She does not have a primary care doctor and she is unaware of any medication allergies.   ROS: Denies fevers or chills, no N/V/D, no cough, no shortness of breath.  She has a distant history of asthma and used to use nebulized albuterol, never hospitalized for this.   Past Medical History  Diagnosis Date  . Degenerative disk disease     No radiologic evicence in EMR  . Tobacco abuse   . Hyperlipidemia   . Atypical chest pain 1/11    Pain is not reproducible with exertion.   . Allergy   . Anemia    Past Surgical History  Procedure Laterality Date  . Other orif  2/09     Fracture of the radial head with  intraarticular extension and joint.   Family History  Problem Relation Age of Onset  . Breast cancer Mother   . Cancer Mother   . Cancer Father      Unknown type.  Marland Kitchen. Heart disease Father   . Cancer Brother     LUNG   Social History  Substance Use Topics  . Smoking status: Former Smoker    Quit date: 10/23/2009  . Smokeless tobacco: Never Used  . Alcohol Use: Yes     Comment: RARE   OB History    No data available     Review of Systems  Constitutional: Negative for fever, chills and fatigue.  Respiratory: Negative for cough, shortness of breath, wheezing and stridor.   Cardiovascular: Negative for chest pain.  All other systems reviewed and are negative.   Allergies  Milk-related compounds  Home Medications   Prior to Admission medications   Medication Sig Start Date End Date Taking? Authorizing Provider  albuterol (PROVENTIL HFA;VENTOLIN HFA) 108 (90 BASE) MCG/ACT inhaler Inhale 2 puffs into the lungs every 6 (six) hours as needed for wheezing or shortness of breath. 09/28/13   Peyton Najjaravid H Hopper, MD  budesonide-formoterol [facility](SYMBICORT) 80-4.5 MCG/ACT inhaler Inhale 2 puffs into the lungs 2 (two) times daily. 09/28/13   Peyton Najjaravid H Hopper, MD  hydrOXYzine (ATARAX/VISTARIL) 25 MG tablet Take 1 tablet (25 mg total) by mouth every 8 (eight) hours as needed. 11/08/15   Barbaraann BarthelJames O Eastyn Skalla, MD  ibuprofen (ADVIL,MOTRIN) 200 MG tablet Take 600-800 mg by mouth daily as needed (pain).    Historical Provider, MD  loratadine (  CLARITIN) 10 MG tablet Take 10 mg by mouth daily.    Historical Provider, MD  predniSONE (DELTASONE) 20 MG tablet Take 3 tablets daily by mouth for 7 days 11/08/15   Barbaraann Barthel, MD  triamcinolone (NASACORT ALLERGY 24HR) 55 MCG/ACT AERO nasal inhaler Place 2 sprays into the nose daily as needed (congestion).    Historical Provider, MD   Meds Ordered and Administered this Visit   Medications  methylPREDNISolone acetate (DEPO-MEDROL) injection 80 mg (not administered)    There were no vitals taken for this visit. No data found.   Physical Exam  Constitutional: She appears well-developed and well-nourished. No distress.   HENT:  Mouth/Throat: Oropharynx is clear and moist. No oropharyngeal exudate.  Eyes: Conjunctivae and EOM are normal. Pupils are equal, round, and reactive to light. Right eye exhibits no discharge. Left eye exhibits no discharge.  Neck: Neck supple.  Cardiovascular: Normal rate, regular rhythm and normal heart sounds.   Pulmonary/Chest: Effort normal and breath sounds normal. No respiratory distress. She has no wheezes. She has no rales. She exhibits no tenderness.  Lymphadenopathy:    She has no cervical adenopathy.  Skin: She is not diaphoretic.  Raised blanching urticarial macules across face, arms, upper chest, fewer along back.  Sizeable blanching urticarial lesion along ulnar aspect of R elbow; R index finger.   EOMI, PERRL.  No involvement of oral mucus membranes.     ED Course  Procedures (including critical care time)  Labs Review Labs Reviewed - No data to display  Imaging Review No results found.   Visual Acuity Review  Right Eye Distance:   Left Eye Distance:   Bilateral Distance:    Right Eye Near:   Left Eye Near:    Bilateral Near:         MDM   1. Urticaria   2. Asthmatic bronchitis    Urticaria, multiple possible triggers (arthropod bite; fleas; insect treatment; new fabric softeners).  No apparent angioedema or difficulty with breathing.  Treatment with antihistamines, steroids for seven days, encouraged to remove debris from insect bombing and return to established household/hygiene products.  To establish care with regular doctor.  Return precautions to [facility] or ED given, as well as possible side effects of antihistamines.   Paula Compton, MD    Barbaraann Barthel, MD 11/08/15 2017

## 2015-11-08 NOTE — ED Notes (Signed)
Patient complains of having a allergic reaction  Has hives on most of her torso and forehead A few of her fingers are swollen as well Not sure if its from a spider bite, fleas,. Or the chemical they used to "bomb" The house for fleas

## 2016-11-09 ENCOUNTER — Ambulatory Visit (INDEPENDENT_AMBULATORY_CARE_PROVIDER_SITE_OTHER): Payer: 59 | Admitting: Podiatry

## 2016-11-09 ENCOUNTER — Encounter: Payer: Self-pay | Admitting: Podiatry

## 2016-11-09 ENCOUNTER — Ambulatory Visit (INDEPENDENT_AMBULATORY_CARE_PROVIDER_SITE_OTHER): Payer: 59

## 2016-11-09 VITALS — Resp 16 | Ht 62.0 in | Wt 195.0 lb

## 2016-11-09 DIAGNOSIS — M205X1 Other deformities of toe(s) (acquired), right foot: Secondary | ICD-10-CM | POA: Diagnosis not present

## 2016-11-09 DIAGNOSIS — M722 Plantar fascial fibromatosis: Secondary | ICD-10-CM

## 2016-11-09 MED ORDER — TRIAMCINOLONE ACETONIDE 10 MG/ML IJ SUSP
10.0000 mg | Freq: Once | INTRAMUSCULAR | Status: AC
  Administered 2016-11-09: 10 mg

## 2016-11-09 MED ORDER — DICLOFENAC SODIUM 75 MG PO TBEC: 75.0000 mg | DELAYED_RELEASE_TABLET | Freq: Two times a day (BID) | ORAL | 2 refills | Status: DC

## 2016-11-09 NOTE — Progress Notes (Signed)
Subjective:     Patient ID: [name], female   DOB: 01/08/1966, 51 y.o.   MRN: [id number]  HPI patient presents stating I have had heel pain for over a year and it's both my heels and my big toe joint on my right foot get sore   Review of Systems  All other systems reviewed and are negative.      Objective:   Physical Exam  Constitutional: She is oriented to person, place, and time.  Cardiovascular: Intact distal pulses.   Musculoskeletal: Normal range of motion.  Neurological: She is oriented to person, place, and time.  Skin: Skin is warm.  Nursing note and vitals reviewed.  neurovascular status intact muscle strength adequate range of motion within normal limits with patient found to have exquisite discomfort plantar aspect heel bilateral at the inflammation and at the insertion into the calcaneus. There is pain around the first MPJ right with mild limitation of motion structural bunion deformity and pain around the dorsal and lateral portion of the joint surface. Patient's found have good digital perfusion and is well oriented 3     Assessment:     Acute plantar fasciitis bilateral heels with long-term structural changes in the arch and hallux limitus deformity right    Plan:     H&P x-rays reviewed and injected the plantar fascial bilateral 3 mg Kenalog 5 mill grams Xylocaine and applied fascial brace bilateral. Gave instructions on physical therapy placed on diclofenac 75 mg twice a day discussed long-term orthotics that would also address and hallux limitus and reappoint in 1 week  X-ray report indicates mild spurring plantar heel with slight elevation of the first metatarsal segment bilateral

## 2016-11-09 NOTE — Progress Notes (Signed)
   Subjective:    Patient ID: [name], female    DOB: 05/12/1966, 51 y.o.   MRN: [id number]  HPI  Chief Complaint  Patient presents with  . Foot Pain    BL; Bottom of heel x "for a long time". Pt stated, "it feels like she is walking on glass  . Toe Pain    Right; Great Toe; "feels like joint pain"        Review of Systems     Objective:   Physical Exam        Assessment & Plan:

## 2016-11-09 NOTE — Patient Instructions (Signed)

## 2016-11-23 ENCOUNTER — Ambulatory Visit (INDEPENDENT_AMBULATORY_CARE_PROVIDER_SITE_OTHER): Payer: 59 | Admitting: Podiatry

## 2016-11-23 ENCOUNTER — Encounter: Payer: Self-pay | Admitting: Podiatry

## 2016-11-23 DIAGNOSIS — M722 Plantar fascial fibromatosis: Secondary | ICD-10-CM | POA: Diagnosis not present

## 2016-11-23 DIAGNOSIS — M205X1 Other deformities of toe(s) (acquired), right foot: Secondary | ICD-10-CM

## 2016-11-23 MED ORDER — TRIAMCINOLONE ACETONIDE 10 MG/ML IJ SUSP
10.0000 mg | Freq: Once | INTRAMUSCULAR | Status: AC
  Administered 2016-11-23: 10 mg

## 2016-11-23 NOTE — Progress Notes (Signed)
Subjective:     Patient ID: [name], female   DOB: 01/06/1966, 51 y.o.   MRN: [id number]  HPI patient states my heels are improving quite a bit of discomfort still in the left heel and also my right big toe joint is really bothering me and I want to get it fixed. Patient states she's tried wider shoes she's tried shoe gear modifications and soaks without relief   Review of Systems     Objective:   Physical Exam Neurovascular status intact muscle strength adequate range of motion within normal limits with patient found to have a reduced range of motion with pain around the first metatarsal head right with redness and he'll right doing much better with the left one being quite sore    Assessment:     Long-term hallux limitus deformity right which is getting worse along with plantar fasciitis left which is present but improving with significant improvement on the right    Plan:     H&P condition reviewed and injected the left plantar fashion 3 Mill grams Kenalog 5 mill grams Xylocaine. Gave instructions on probable long-term orthotics but at this time she wants to address the big toe joint right and I recommended a biplanar-type osteotomy and explained procedure and risk. At this time patient reviewed the consent form at great length and will have a biplanar osteotomy to both lower and reappoint reduce the angle between the first and second metatarsal and she understands that total recovery from this is a approximate 6 months to one year and I did dispense air fracture walker today with all instructions on usage patient is encouraged to call with any questions  X-rays indicate that there is significant spurring of the dorsal first metatarsal head left with elevation of the intermetatarsal angle of approximately 15 and narrowing of the joint surface

## 2016-11-23 NOTE — Patient Instructions (Signed)
Pre-Operative Instructions  Congratulations, you have decided to take an important step to improving your quality of life.  You can be assured that the doctors of [facility] will be with you every step of the way.  1. Plan to be at the surgery center/hospital at least 1 (one) hour prior to your scheduled time unless otherwise directed by the surgical center/hospital staff.  You must have a responsible adult accompany you, remain during the surgery and drive you home.  Make sure you have directions to the surgical center/hospital and know how to get there on time. 2. For hospital based surgery you will need to obtain a history and physical form from your family physician within 1 month prior to the date of surgery- we will give you a form for you primary physician.  3. We make every effort to accommodate the date you request for surgery.  There are however, times where surgery dates or times have to be moved.  We will contact you as soon as possible if a change in schedule is required.   4. No Aspirin/Ibuprofen for one week before surgery.  If you are on aspirin, any non-steroidal anti-inflammatory medications (Mobic, Aleve, Ibuprofen) you should stop taking it 7 days prior to your surgery.  You make take Tylenol  For pain prior to surgery.  5. Medications- If you are taking daily heart and blood pressure medications, seizure, reflux, allergy, asthma, anxiety, pain or diabetes medications, make sure the surgery center/hospital is aware before the day of surgery so they may notify you which medications to take or avoid the day of surgery. 6. No food or drink after midnight the night before surgery unless directed otherwise by surgical center/hospital staff. 7. No alcoholic beverages 24 hours prior to surgery.  No smoking 24 hours prior to or 24 hours after surgery. 8. Wear loose pants or shorts- loose enough to fit over bandages, boots, and casts. 9. No slip on shoes, sneakers are best. 10. Bring  your boot with you to the surgery center/hospital.  Also bring crutches or a walker if your physician has prescribed it for you.  If you do not have this equipment, it will be provided for you after surgery. 11. If you have not been contracted by the surgery center/hospital by the day before your surgery, call to confirm the date and time of your surgery. 12. Leave-time from work may vary depending on the type of surgery you have.  Appropriate arrangements should be made prior to surgery with your employer. 13. Prescriptions will be provided immediately following surgery by your doctor.  Have these filled as soon as possible after surgery and take the medication as directed. 14. Remove nail polish on the operative foot. 15. Wash the night before surgery.  The night before surgery wash the foot and leg well with the antibacterial soap provided and water paying special attention to beneath the toenails and in between the toes.  Rinse thoroughly with water and dry well with a towel.  Perform this wash unless told not to do so by your physician.  Enclosed: 1 Ice pack (please put in freezer the night before surgery)   1 Hibiclens skin cleaner   Pre-op Instructions  If you have any questions regarding the instructions, do not hesitate to call our office.  [place]: [address] [place], [place] [address] [phone]  [place]: [address] [place], [place] [address] [phone]  [place]: [address]  [place], [place] [address] [phone]   Dr.   Norman Regal DPM, Dr. Matthew Wagoner DPM, Dr. M. Todd Hyatt DPM, Dr. Titorya Stover DPM 

## 2016-12-20 ENCOUNTER — Encounter: Payer: Self-pay | Admitting: Podiatry

## 2016-12-20 DIAGNOSIS — M2011 Hallux valgus (acquired), right foot: Secondary | ICD-10-CM

## 2016-12-20 DIAGNOSIS — J45909 Unspecified asthma, uncomplicated: Secondary | ICD-10-CM | POA: Diagnosis not present

## 2016-12-20 DIAGNOSIS — M21611 Bunion of right foot: Secondary | ICD-10-CM | POA: Diagnosis not present

## 2016-12-26 ENCOUNTER — Ambulatory Visit (INDEPENDENT_AMBULATORY_CARE_PROVIDER_SITE_OTHER): Payer: 59 | Admitting: Podiatry

## 2016-12-26 ENCOUNTER — Ambulatory Visit (INDEPENDENT_AMBULATORY_CARE_PROVIDER_SITE_OTHER): Payer: 59

## 2016-12-26 ENCOUNTER — Encounter: Payer: Self-pay | Admitting: Podiatry

## 2016-12-26 VITALS — Temp 98.0°F

## 2016-12-26 DIAGNOSIS — M205X1 Other deformities of toe(s) (acquired), right foot: Secondary | ICD-10-CM | POA: Diagnosis not present

## 2016-12-26 DIAGNOSIS — M722 Plantar fascial fibromatosis: Secondary | ICD-10-CM

## 2016-12-28 NOTE — Progress Notes (Signed)
Subjective:    Patient ID: [name], female   DOB: 51 y.o.   MRN: [id number]   HPI patient presents stating her right foot is doing very well with minimal discomfort and able to walk without pain    ROS      Objective:  Physical Exam Neurovascular status intact negative Homans sign noted with good range of motion and no crepitus of the joint surface    Assessment:     Doing well post osteotomy right    Plan:     X-rays taken reviewed and advised on continued elevation compression immobilization and reappoint to recheck again in approximately 3 weeks  X-ray report indicates osteotomy is healing well with no indications of pathology

## 2017-01-16 ENCOUNTER — Ambulatory Visit (INDEPENDENT_AMBULATORY_CARE_PROVIDER_SITE_OTHER): Payer: Self-pay | Admitting: Podiatry

## 2017-01-16 ENCOUNTER — Ambulatory Visit (INDEPENDENT_AMBULATORY_CARE_PROVIDER_SITE_OTHER): Payer: 59

## 2017-01-16 ENCOUNTER — Encounter: Payer: Self-pay | Admitting: Podiatry

## 2017-01-16 DIAGNOSIS — M205X1 Other deformities of toe(s) (acquired), right foot: Secondary | ICD-10-CM | POA: Diagnosis not present

## 2017-01-16 DIAGNOSIS — Z9889 Other specified postprocedural states: Secondary | ICD-10-CM

## 2017-01-16 NOTE — Progress Notes (Signed)
   Subjective:  Patient presents today status post bunionectomy of the right foot by Dr. Charlsie Merlesegal. DOS: 12/20/16. She states she is doing well and has no complaints at this time.     Objective/Physical Exam Skin incisions appear to be well coapted with sutures and staples intact. No sign of infectious process noted. No dehiscence. No active bleeding noted. Moderate edema noted to the surgical extremity.  Radiographic Exam:  Orthopedic hardware and osteotomies sites appear to be stable with routine healing.  Assessment: 1. s/p bunionectomy of right foot by Dr. Charlsie Merlesegal DOS: 12/20/16   Plan of Care:  1. Patient was evaluated. 2. Doing well. 3. Return to work full duty with no restrictions.  4. Return to clinic in 2 weeks with Dr. Charlsie Merlesegal.   Felecia ShellingBrent M. Evans, DPM [facility]  Dr. Felecia ShellingBrent M. Evans, DPM    [address]                                        [place], [place] [address]                Office [phone]  Fax [phone]

## 2017-01-19 ENCOUNTER — Telehealth: Payer: Self-pay | Admitting: *Deleted

## 2017-01-19 NOTE — Telephone Encounter (Addendum)
[employer] states needs return to work date. 01/26/2017-Kelly [employer] states they need to know if pt has been cleared for return to work, [employer] 12/20/2016.

## 2017-01-23 NOTE — Progress Notes (Signed)
DOS [id number] Bi- planar [place] (cutting and moving bone)with pin fixation right foot

## 2017-02-02 ENCOUNTER — Ambulatory Visit (INDEPENDENT_AMBULATORY_CARE_PROVIDER_SITE_OTHER): Payer: 59 | Admitting: Podiatry

## 2017-02-02 ENCOUNTER — Ambulatory Visit (INDEPENDENT_AMBULATORY_CARE_PROVIDER_SITE_OTHER): Payer: 59

## 2017-02-02 ENCOUNTER — Encounter: Payer: Self-pay | Admitting: Podiatry

## 2017-02-02 DIAGNOSIS — M205X1 Other deformities of toe(s) (acquired), right foot: Secondary | ICD-10-CM | POA: Diagnosis not present

## 2017-02-02 NOTE — Progress Notes (Signed)
Subjective:    Patient ID: [name], female   DOB: 51 y.o.   MRN: [id number]   HPI gradually improving with my right foot with swelling still present but seems to be getting better all the time    ROS      Objective:  Physical Exam Neurovascular status intact negative Homans sign was noted with range of motion adequate of the first MPJ with no crepitus the joint noted and wound edges well coapted    Assessment:   Doing well post surgery right      Plan:    X-rays reviewed and allow patient to continue with range of motion activities and to continue with increased activity.  X-rays indicate that there is good alignment with pins in place joint congruence and no indication of bone motion

## 2017-03-02 ENCOUNTER — Encounter: Payer: Self-pay | Admitting: Podiatry

## 2017-03-02 ENCOUNTER — Ambulatory Visit: Payer: 59

## 2017-03-02 ENCOUNTER — Ambulatory Visit (INDEPENDENT_AMBULATORY_CARE_PROVIDER_SITE_OTHER): Payer: 59

## 2017-03-02 ENCOUNTER — Ambulatory Visit (INDEPENDENT_AMBULATORY_CARE_PROVIDER_SITE_OTHER): Payer: 59 | Admitting: Podiatry

## 2017-03-02 VITALS — BP 147/95 | HR 88 | Resp 16

## 2017-03-02 DIAGNOSIS — M205X1 Other deformities of toe(s) (acquired), right foot: Secondary | ICD-10-CM | POA: Diagnosis not present

## 2017-03-02 DIAGNOSIS — M722 Plantar fascial fibromatosis: Secondary | ICD-10-CM

## 2017-03-02 NOTE — Progress Notes (Signed)
Subjective:    Patient ID: [name], female   DOB: 51 y.o.   MRN: [id number]   HPI patient presents stating the big toe joint is doing pretty well with minimal discomfort and she states that it still feels a little stiff and she's ready for orthotics due to his chronic foot pain    ROS      Objective:  Physical Exam neurovascular status intact negative Homans sign was noted with range of motion first MPJ continuing to improve 3 months after having osteotomy. Patient's found to have good digital perfusion well oriented 3 with negative Homans sign and does have moderate tendinitis-like symptomatology     Assessment:    Doing well post osteotomy first metatarsal right with good range of motion with patient found to have moderate tenderness symptoms     Plan:    X-ray reviewed encourage range of motion of the first MPJ and at this time went ahead and scanned for custom orthotics to reduce plantar pressure and to encourage good motion of the first MPJ. Patient be seen back to recheck when ready  X-rays indicate the osteotomy is healing well pins in place joint (congruence

## 2017-03-05 ENCOUNTER — Emergency Department (HOSPITAL_COMMUNITY)
Admission: EM | Admit: 2017-03-05 | Discharge: 2017-03-05 | Disposition: A | Discharge status: Home / Self Care | Payer: 59 | Attending: Emergency Medicine | Admitting: Emergency Medicine

## 2017-03-05 ENCOUNTER — Emergency Department (HOSPITAL_COMMUNITY): Payer: 59

## 2017-03-05 ENCOUNTER — Ambulatory Visit (HOSPITAL_COMMUNITY)
Admission: EM | Admit: 2017-03-05 | Discharge: 2017-03-05 | Disposition: A | Discharge status: Nursing Home (Includes ICF) | Payer: 59 | Attending: Internal Medicine | Admitting: Internal Medicine

## 2017-03-05 ENCOUNTER — Encounter (HOSPITAL_COMMUNITY): Payer: Self-pay

## 2017-03-05 ENCOUNTER — Encounter (HOSPITAL_COMMUNITY): Payer: Self-pay | Admitting: Emergency Medicine

## 2017-03-05 DIAGNOSIS — Z7951 Long term (current) use of inhaled steroids: Secondary | ICD-10-CM | POA: Diagnosis not present

## 2017-03-05 DIAGNOSIS — J209 Acute bronchitis, unspecified: Secondary | ICD-10-CM | POA: Insufficient documentation

## 2017-03-05 DIAGNOSIS — Z87891 Personal history of nicotine dependence: Secondary | ICD-10-CM | POA: Insufficient documentation

## 2017-03-05 DIAGNOSIS — R079 Chest pain, unspecified: Secondary | ICD-10-CM | POA: Diagnosis not present

## 2017-03-05 DIAGNOSIS — J4541 Moderate persistent asthma with (acute) exacerbation: Secondary | ICD-10-CM | POA: Diagnosis not present

## 2017-03-05 DIAGNOSIS — R0789 Other chest pain: Secondary | ICD-10-CM

## 2017-03-05 DIAGNOSIS — J40 Bronchitis, not specified as acute or chronic: Secondary | ICD-10-CM | POA: Diagnosis not present

## 2017-03-05 DIAGNOSIS — J4531 Mild persistent asthma with (acute) exacerbation: Secondary | ICD-10-CM | POA: Diagnosis not present

## 2017-03-05 LAB — CBC
HEMATOCRIT: 38.5 % (ref 36.0–46.0)
HEMOGLOBIN: 12.7 g/dL (ref 12.0–15.0)
MCH: 30.4 pg (ref 26.0–34.0)
MCHC: 33 g/dL (ref 30.0–36.0)
MCV: 92.1 fL (ref 78.0–100.0)
Platelets: 386 10*3/uL (ref 150–400)
RBC: 4.18 MIL/uL (ref 3.87–5.11)
RDW: 12.5 % (ref 11.5–15.5)
WBC: 9.3 10*3/uL (ref 4.0–10.5)

## 2017-03-05 LAB — BASIC METABOLIC PANEL
ANION GAP: 7 (ref 5–15)
BUN: 16 mg/dL (ref 6–20)
CHLORIDE: 109 mmol/L (ref 101–111)
CO2: 24 mmol/L (ref 22–32)
Calcium: 9 mg/dL (ref 8.9–10.3)
Creatinine, Ser: 0.67 mg/dL (ref 0.44–1.00)
GFR calc non Af Amer: >60 mL/min (ref >60)
GLUCOSE: 94 mg/dL (ref 65–99)
Potassium: 3.8 mmol/L (ref 3.5–5.1)
Sodium: 140 mmol/L (ref 135–145)

## 2017-03-05 LAB — I-STAT TROPONIN, ED: Troponin i, poc: 0 ng/mL (ref 0.00–0.08)

## 2017-03-05 MED ORDER — DM-GUAIFENESIN ER 30-600 MG PO TB12: 1.0000 | ORAL_TABLET | Freq: Two times a day (BID) | ORAL | 1 refills | Status: DC

## 2017-03-05 MED ORDER — IPRATROPIUM-ALBUTEROL 0.5-2.5 (3) MG/3ML IN SOLN
3.0000 mL | Freq: Once | RESPIRATORY_TRACT | Status: AC
  Administered 2017-03-05: 3 mL via RESPIRATORY_TRACT
  Filled 2017-03-05: qty 3

## 2017-03-05 MED ORDER — ALBUTEROL SULFATE HFA 108 (90 BASE) MCG/ACT IN AERS
2.0000 | INHALATION_SPRAY | Freq: Four times a day (QID) | RESPIRATORY_TRACT | Status: DC
  Administered 2017-03-05: 2 via RESPIRATORY_TRACT
  Filled 2017-03-05: qty 6.7

## 2017-03-05 NOTE — ED Provider Notes (Signed)
CSN: [id number]     Arrival date & time 03/05/17  1544 History   First MD Initiated Contact with Patient 03/05/17 1656     Chief Complaint  Patient presents with  . Cough  . Shortness of Breath   (Consider location/radiation/quality/duration/timing/severity/associated sxs/prior Treatment) [name] is a 51 y.o. female with past history of hypertension, is a former smoker, and high cholesterol, who [facility] with a chief complaint of chief complaint of chest pain and cough. Chest pain is substernal, pressure, radiating to the left arm, states she woke up this morning with the pain, denies been waxing and waning. Denies any heart palpitations, but has had swelling in her hands, feet, and ankles. States that she is becoming more "winded" whenever she is been walking, she sleeps flat at night with one pillow, family history is significant for MI, stenting father had 4 heart attacks, and mother had one. Does not have primary care, and has not been to a cardiologist.    The history is provided by the patient.  Cough  Associated symptoms: chest pain and shortness of breath   Associated symptoms: no fever   Shortness of Breath  Associated symptoms: chest pain and cough   Associated symptoms: no abdominal pain, no fever, no neck pain, no syncope and no vomiting   Chest Pain  Pain location:  Substernal area Pain quality: pressure and tightness   Pain radiates to:  L arm Pain severity:  Moderate Onset quality:  Sudden Duration:  12 hours Timing:  Intermittent Progression:  Waxing and waning Chronicity:  New Context: at rest   Context: not breathing, not eating, not lifting, not movement, not raising an arm, not stress and not trauma   Relieved by:  Nothing Worsened by:  Nothing Ineffective treatments:  None tried Associated symptoms: cough, lower extremity edema and shortness of breath   Associated symptoms: no abdominal pain, no back pain, no  dizziness, no fever, no nausea, no near-syncope, no numbness, no orthopnea, no syncope, no vomiting and no weakness   Risk factors: hypertension and obesity   Risk factors: no diabetes mellitus and no smoking     Past Medical History:  Diagnosis Date  . Allergy   . Anemia   . Atypical chest pain 1/11   Pain is not reproducible with exertion.   . Degenerative disk disease    No radiologic evicence in EMR  . Hyperlipidemia   . Tobacco abuse    Past Surgical History:  Procedure Laterality Date  . Other ORIF  2/09    Fracture of the radial head with  intraarticular extension and joint.   Family History  Problem Relation Age of Onset  . Breast cancer Mother   . Cancer Mother   . Cancer Father        Unknown type.  Marland Kitchen. Heart disease Father   . Cancer Brother        LUNG   Social History  Substance Use Topics  . Smoking status: Former Smoker    Quit date: 10/23/2009  . Smokeless tobacco: Never Used  . Alcohol use Yes     Comment: RARE   OB History    No data available     Review of Systems  Constitutional: Negative for fever.  HENT: Negative.   Respiratory: Positive for cough and shortness of breath.   Cardiovascular: Positive for chest pain. Negative for orthopnea, syncope and near-syncope.  Gastrointestinal: Negative for abdominal pain, nausea  and vomiting.  Musculoskeletal: Negative for back pain and neck pain.  Skin: Negative.   Neurological: Negative for dizziness, weakness and numbness.    Allergies  Milk-related compounds  Home Medications   Prior to Admission medications   Medication Sig Start Date End Date Taking? Authorizing Provider  albuterol (PROVENTIL HFA;VENTOLIN HFA) 108 (90 BASE) MCG/ACT inhaler Inhale 2 puffs into the lungs every 6 (six) hours as needed for wheezing or shortness of breath. 09/28/13   Peyton Najjar, MD  budesonide-formoterol (SYMBICORT) 80-4.5 MCG/ACT inhaler Inhale 2 puffs into the lungs 2 (two) times daily. 09/28/13   Peyton Najjar, MD  diclofenac (VOLTAREN) 75 MG EC tablet Take 1 tablet (75 mg total) by mouth 2 (two) times daily. 11/09/16   Lenn Sink, DPM  ibuprofen (ADVIL,MOTRIN) 200 MG tablet Take 600-800 mg by mouth daily as needed (pain).    [provider]  loratadine (CLARITIN) 10 MG tablet Take 10 mg by mouth daily.    [provider]  ondansetron (ZOFRAN) 4 MG tablet Take 4 mg by mouth every 8 (eight) hours as needed for nausea or vomiting. 12/20/16   Lenn Sink, DPM  triamcinolone (NASACORT ALLERGY 24HR) 55 MCG/ACT AERO nasal inhaler Place 2 sprays into the nose daily as needed (congestion).    [provider]   Meds Ordered and Administered this Visit  Medications - No data to display  BP (!) 183/96   Pulse 89   Temp 97.9 F (36.6 C) (Oral)   Resp 16   Ht 5\' 2"  (1.575 m)   Wt 195 lb (88.5 kg)   SpO2 100%   BMI 35.67 kg/m  No data found.   Physical Exam  Constitutional: She is oriented to person, place, and time. She appears well-developed and well-nourished. No distress.  HENT:  Head: Normocephalic and atraumatic.  Right Ear: External ear normal.  Left Ear: External ear normal.  Eyes: Conjunctivae are normal.  Neck: Normal range of motion.  Cardiovascular: Normal rate, regular rhythm, normal heart sounds and intact distal pulses.   No murmur heard. Pulmonary/Chest: Effort normal and breath sounds normal.  Neurological: She is alert and oriented to person, place, and time.  Skin: Skin is warm and dry. Capillary refill takes less than 2 seconds. No rash noted. She is not diaphoretic. No erythema.  Psychiatric: She has a normal mood and affect. Her behavior is normal.  Nursing note and vitals reviewed.   Urgent Care Course     Procedures (including critical care time)  Labs Review Labs Reviewed - No data to display  Imaging Review No results found.   MDM   1. Chest pain, unspecified type    Recommend going to the emergency room for  further evaluation and management of her condition due to chest pain, and also uncontrolled hypertension.    Dorena Bodo, NP 03/05/17 1728

## 2017-03-05 NOTE — ED Triage Notes (Signed)
PT reports left shoulder and arm pain for 1 month. PT reports a cough that started this morning. PT reports "chest pain" only when coughing. PT reports pain when coughing occurs along ribs and under breasts. PT also reports SOB

## 2017-03-05 NOTE — Discharge Instructions (Signed)
Use albuterol inhaler 2 puffs 6 hours for the next 7 days then as needed. Take Mucinex DM to help with the cough and the phlegm. Return for any new or worse symptoms. Referral information provided to the wellness clinic.

## 2017-03-05 NOTE — Discharge Instructions (Signed)
Based on your history, physical exam findings, and risk factors, I recommend you go to the emergency room for further evaluation.

## 2017-03-05 NOTE — ED Provider Notes (Signed)
[facility]-EMERGENCY DEPT Provider Note   CSN: [id number] Arrival date & time: 03/05/17  1712     History   Chief Complaint Chief Complaint  Patient presents with  . Chest Pain    HPI [name] is a 51 y.o. female.  Patient referred from urgent care for evaluation of chest pain. Patient states that she's had a history of reactive airway disease asthma in the past and has been on albuterol inhaler but currently is not. Patient states that she has had a productive cough with yellow phlegm. Chest pain is only with cough. It's on the lower anterior part of the chest bilaterally. She does report shortness of breath. No syncope no passing out no abdominal pain no nausea no vomiting no fevers. Chest pain is only present with cough.      Past Medical History:  Diagnosis Date  . Allergy   . Anemia   . Atypical chest pain 1/11   Pain is not reproducible with exertion.   . Degenerative disk disease    No radiologic evicence in EMR  . Hyperlipidemia   . Tobacco abuse     Patient Active Problem List   Diagnosis Date Noted  . Infertility, female, secondary 10/24/2011  . HYPERLIPIDEMIA 09/07/2009  . TOBACCO ABUSE 09/07/2009  . CONTACT DERMATITIS&OTHER ECZEMA DUE UNSPEC CAUSE 09/07/2009  . CHEST PAIN, ATYPICAL 09/07/2009    Past Surgical History:  Procedure Laterality Date  . Other ORIF  2/09    Fracture of the radial head with  intraarticular extension and joint.    OB History    No data available       Home Medications    Prior to Admission medications   Medication Sig Start Date End Date Taking? Authorizing Provider  albuterol (PROVENTIL HFA;VENTOLIN HFA) 108 (90 BASE) MCG/ACT inhaler Inhale 2 puffs into the lungs every 6 (six) hours as needed for wheezing or shortness of breath. 09/28/13   Peyton Najjar, MD  budesonide-formoterol (SYMBICORT) 80-4.5 MCG/ACT inhaler Inhale 2 puffs into the lungs 2 (two) times daily. 09/28/13   Peyton Najjar, MD    dextromethorphan-guaiFENesin [facility] DM) 30-600 MG 12hr tablet Take 1 tablet by mouth 2 (two) times daily. 03/05/17   Vanetta Mulders, MD  diclofenac (VOLTAREN) 75 MG EC tablet Take 1 tablet (75 mg total) by mouth 2 (two) times daily. 11/09/16   Lenn Sink, DPM  ibuprofen (ADVIL,MOTRIN) 200 MG tablet Take 600-800 mg by mouth daily as needed (pain).    [provider]  loratadine (CLARITIN) 10 MG tablet Take 10 mg by mouth daily.    [provider]  ondansetron (ZOFRAN) 4 MG tablet Take 4 mg by mouth every 8 (eight) hours as needed for nausea or vomiting. 12/20/16   Lenn Sink, DPM  triamcinolone (NASACORT ALLERGY 24HR) 55 MCG/ACT AERO nasal inhaler Place 2 sprays into the nose daily as needed (congestion).    [provider]    Family History Family History  Problem Relation Age of Onset  . Breast cancer Mother   . Cancer Mother   . Cancer Father        Unknown type.  Marland Kitchen Heart disease Father   . Cancer Brother        LUNG    Social History Social History  Substance Use Topics  . Smoking status: Former Smoker    Quit date: 10/23/2009  . Smokeless tobacco: Never Used  . Alcohol use Yes     Comment: RARE  Allergies   Milk-related compounds   Review of Systems Review of Systems  Constitutional: Negative for fever.  HENT: Negative for congestion.   Eyes: Negative for visual disturbance.  Respiratory: Positive for shortness of breath and wheezing.   Cardiovascular: Positive for chest pain.  Gastrointestinal: Negative for abdominal pain, nausea and vomiting.  Genitourinary: Negative for dysuria.  Musculoskeletal: Negative for back pain.  Skin: Negative for rash.  Neurological: Negative for headaches.  Hematological: Does not bruise/bleed easily.  Psychiatric/Behavioral: Negative for confusion.     Physical Exam Updated Vital Signs BP 124/78   Pulse 90   Temp 98.3 F (36.8 C) (Oral)   Resp 14   SpO2 98%   Physical Exam   Constitutional: She is oriented to person, place, and time. She appears well-developed and well-nourished. No distress.  HENT:  Head: Normocephalic and atraumatic.  Mouth/Throat: Oropharynx is clear and moist.  Eyes: Conjunctivae and EOM are normal. Pupils are equal, round, and reactive to light.  Neck: Normal range of motion. Neck supple.  Cardiovascular: Normal rate, regular rhythm and normal heart sounds.   Pulmonary/Chest: Effort normal. No respiratory distress. She has wheezes.  Bilateral wheezing  Abdominal: Soft. Bowel sounds are normal. There is no tenderness.  Musculoskeletal: Normal range of motion. She exhibits no edema.  Neurological: She is alert and oriented to person, place, and time. No cranial nerve deficit or sensory deficit. She exhibits normal muscle tone. Coordination normal.  Skin: Skin is warm.  Nursing note and vitals reviewed.    ED Treatments / Results  Labs (all labs ordered are listed, but only abnormal results are displayed) Labs Reviewed  BASIC METABOLIC PANEL  CBC  I-STAT TROPOININ, ED    EKG  EKG Interpretation  Date/Time:  Sunday March 05 2017 17:15:55 EDT Ventricular Rate:  96 PR Interval:  124 QRS Duration: 86 QT Interval:  352 QTC Calculation: 444 R Axis:   46 Text Interpretation:  Normal sinus rhythm Nonspecific ST and T wave abnormality Abnormal ECG Confirmed by Vanetta Mulders [id number]) on 03/05/2017 6:54:25 PM       Radiology Dg Chest 2 View  Result Date: 03/05/2017 CLINICAL DATA:  Initial evaluation for acute chest pain. EXAM: CHEST  2 VIEW COMPARISON:  Prior radiograph from 09/28/2013. FINDINGS: Mild cardiomegaly, stable. Mediastinal silhouette within normal limits. Lungs mildly hypoinflated. No focal infiltrates. No pulmonary edema or pleural effusion. No pneumothorax. No acute osseus abnormality. IMPRESSION: 1. No active cardiopulmonary disease. 2. Mild cardiomegaly without pulmonary edema. Electronically Signed   By: Rise Mu M.D.   On: 03/05/2017 17:38    Procedures Procedures (including critical care time)  Medications Ordered in ED Medications  albuterol (PROVENTIL HFA;VENTOLIN HFA) 108 (90 Base) MCG/ACT inhaler 2 puff (not administered)  ipratropium-albuterol (DUONEB) 0.5-2.5 (3) MG/3ML nebulizer solution 3 mL (3 mLs Nebulization Given 03/05/17 1934)     Initial Impression / Assessment and Plan / ED Course  I have reviewed the triage vital signs and the nursing notes.  Pertinent labs & imaging results that were available during my care of the patient were reviewed by me and considered in my medical decision making (see chart for details).     Patient seen by urgent care and then referred here for evaluation of chest pain. Chest pain is very chest wall in nature only present with cough. Patient's had cough and phlegm and with the cough has bilateral anterior lower chest pain. Not really substernal.  Patient feels much better after albuterol inhaler. Patient had  some faint wheezing upon arrival. Patient's had a history of reactive airway disease or asthma in the past. Patient nontoxic no acute distress. Oxygen saturations are on room air in the upper 90s. No concern for pulmonary embolus. Not tachycardic. Will treat with Mucinex DM and albuterol inhaler and follow-up wellness clinic.  Patient's troponin was negative chest x-ray without any acute findings no evidence of pneumonia pneumothorax or pulmonary edema.  Final Clinical Impressions(s) / ED Diagnoses   Final diagnoses:  Chest wall pain  Moderate persistent asthma with acute exacerbation  Bronchitis    New Prescriptions New Prescriptions   DEXTROMETHORPHAN-GUAIFENESIN (MUCINEX DM) 30-600 MG 12HR TABLET    Take 1 tablet by mouth 2 (two) times daily.     Vanetta MuldersZackowski, Geordan Xu, MD 03/05/17 2001

## 2017-03-05 NOTE — ED Triage Notes (Signed)
Pt reports a dull pressure chest pain located under both breasts and in the epigastric area associated with coughing. She reports productive cough of yellow phlegm. She also reports left arm/shoulder pain x 1 months. She reports exertional SOB as well.

## 2017-03-13 ENCOUNTER — Ambulatory Visit: Payer: 59 | Attending: Internal Medicine | Admitting: Physician Assistant

## 2017-03-13 ENCOUNTER — Encounter: Payer: Self-pay | Admitting: Physician Assistant

## 2017-03-13 VITALS — BP 152/103 | HR 89 | Temp 98.3°F | Resp 16 | Wt 193.8 lb

## 2017-03-13 DIAGNOSIS — R059 Cough, unspecified: Secondary | ICD-10-CM

## 2017-03-13 DIAGNOSIS — I10 Essential (primary) hypertension: Secondary | ICD-10-CM | POA: Diagnosis not present

## 2017-03-13 DIAGNOSIS — Z8249 Family history of ischemic heart disease and other diseases of the circulatory system: Secondary | ICD-10-CM | POA: Insufficient documentation

## 2017-03-13 DIAGNOSIS — R0789 Other chest pain: Secondary | ICD-10-CM

## 2017-03-13 DIAGNOSIS — Z803 Family history of malignant neoplasm of breast: Secondary | ICD-10-CM | POA: Diagnosis not present

## 2017-03-13 DIAGNOSIS — E785 Hyperlipidemia, unspecified: Secondary | ICD-10-CM | POA: Insufficient documentation

## 2017-03-13 DIAGNOSIS — J40 Bronchitis, not specified as acute or chronic: Secondary | ICD-10-CM | POA: Diagnosis not present

## 2017-03-13 DIAGNOSIS — R05 Cough: Secondary | ICD-10-CM

## 2017-03-13 DIAGNOSIS — M25512 Pain in left shoulder: Secondary | ICD-10-CM | POA: Insufficient documentation

## 2017-03-13 DIAGNOSIS — Z79899 Other long term (current) drug therapy: Secondary | ICD-10-CM | POA: Diagnosis not present

## 2017-03-13 MED ORDER — LISINOPRIL 10 MG PO TABS: 10.0000 mg | ORAL_TABLET | Freq: Every day | ORAL | 1 refills | Status: DC

## 2017-03-13 NOTE — Patient Instructions (Signed)
Low salt diet

## 2017-03-13 NOTE — Progress Notes (Signed)
Wanda ArBrigitte Lensing  ZOX:[id number]  [id number]  DOB - 05/27/1966  Chief Complaint  Patient presents with  . Follow-up    cough and CP       Subjective:   [name] is a 51 y.o. female here today for establishment of care. She has a past medical history of hyperlipidemia, smoking, elevated blood pressure and a family history of coronary artery disease. She presented to urgent care on 03/05/2017 with chest pain and a cough. She described bilateral lower chest discomfort usually associated with coughing. She also was having some trouble with her left shoulder and thought that all of her symptoms were going together. She had productive yellow sputum. She also had some dyspnea and shortness of breath. The urgent care center to the emergency department for further evaluation. In the ED her cardiac enzymes were within normal limits. Her EKG showed sinus rhythm with nonspecific ST-T wave changes. Her CBC and BMP were within normal limits. A chest x-ray showed no acute process. She was diagnosed with bronchitis. She was given inhaler and Mucinex with improvement. She's had no recurrence of her chest pain. Her breathing has improved. Her cough is dissipating as well.  In regards to her left shoulder she describes posterior pain with decreased range of motion, intermittent for 2 months. Ibuprofen with temporary relief.   ROS: GEN: denies fever or chills, denies change in weight Skin: denies lesions or rashes HEENT: denies headache, earache, epistaxis, sore throat, or neck pain +cough-improving LUNGS: im SHOB, dyspnea, PND, orthopnea CV: + CP or palpitations ABD: denies abd pain, N or V EXT: denies muscle spasms or swelling; no pain in lower ext, no weakness NEURO: denies numbness or tingling, denies sz, stroke or TIA  ALLERGIES: Allergies  Allergen Reactions  . Milk-Related Compounds Other (See Comments)    Gi upset    PAST MEDICAL HISTORY: Past Medical History:  Diagnosis Date   . Allergy   . Anemia   . Atypical chest pain 1/11   Pain is not reproducible with exertion.   . Degenerative disk disease    No radiologic evicence in EMR  . Hyperlipidemia   . Tobacco abuse     PAST SURGICAL HISTORY: Past Surgical History:  Procedure Laterality Date  . Other ORIF  2/09    Fracture of the radial head with  intraarticular extension and joint.    MEDICATIONS AT HOME: Prior to Admission medications   Medication Sig Start Date End Date Taking? Authorizing Provider  albuterol (PROVENTIL HFA;VENTOLIN HFA) 108 (90 BASE) MCG/ACT inhaler Inhale 2 puffs into the lungs every 6 (six) hours as needed for wheezing or shortness of breath. 09/28/13  Yes Peyton NajjarHopper, David H, MD  dextromethorphan-guaiFENesin [facility](MUCINEX DM) 30-600 MG 12hr tablet Take 1 tablet by mouth 2 (two) times daily. 03/05/17  Yes Vanetta MuldersZackowski, Scott, MD  ibuprofen (ADVIL,MOTRIN) 200 MG tablet Take 600-800 mg by mouth daily as needed (pain).   Yes [provider]  loratadine (CLARITIN) 10 MG tablet Take 10 mg by mouth daily.   Yes [provider]  budesonide-formoterol (SYMBICORT) 80-4.5 MCG/ACT inhaler Inhale 2 puffs into the lungs 2 (two) times daily. Patient not taking: Reported on 03/13/2017 09/28/13   Peyton NajjarHopper, David H, MD  diclofenac (VOLTAREN) 75 MG EC tablet Take 1 tablet (75 mg total) by mouth 2 (two) times daily. Patient not taking: Reported on 03/13/2017 11/09/16   Lenn Sinkegal, Norman S, DPM  lisinopril (PRINIVIL,ZESTRIL) 10 MG tablet Take 1 tablet (10 mg total) by mouth daily. 03/13/17  Danelle Earthly, Shawneequa Baldridge S, PA-C  ondansetron (ZOFRAN) 4 MG tablet Take 4 mg by mouth every 8 (eight) hours as needed for nausea or vomiting. 12/20/16   Lenn Sink, DPM  triamcinolone (NASACORT ALLERGY 24HR) 55 MCG/ACT AERO nasal inhaler Place 2 sprays into the nose daily as needed (congestion).    [provider]    Family History  Problem Relation Age of Onset  . Breast cancer Mother   . Cancer Mother   . Cancer Father         Unknown type.  Marland Kitchen Heart disease Father   . Cancer Brother        LUNG   Social-married. Works. Smokes. No ETOH or drugs.  Objective:   Vitals:   03/13/17 0956  BP: (!) 152/103  Pulse: 89  Resp: 16  Temp: 98.3 F (36.8 C)  TempSrc: Oral  SpO2: 95%  Weight: 193 lb 12.8 oz (87.9 kg)    Exam General appearance : Awake, alert, not in any distress. Speech Clear. Not toxic looking HEENT: Atraumatic and Normocephalic, pupils equally reactive to light and accomodation Neck: supple, no JVD. No cervical lymphadenopathy.  Chest:Good air entry bilaterally, no added sounds  CVS: S1 S2 regular, no murmurs.  Extremities: B/L Lower Ext shows no edema, both legs are warm to touch; dec ROM left shoulder Neurology: Awake alert, and oriented X 3, CN II-XII intact, Non focal   Data Review Lab Results  Component Value Date   HGBA1C  08/20/2009    5.5 (NOTE) The ADA recommends the following therapeutic goal for glycemic control related to Hgb A1c measurement: Goal of therapy: <6.5 Hgb A1c  Reference: American Diabetes Association: Clinical Practice Recommendations 2010, Diabetes Care, 2010, 33: (Suppl  1).     Assessment & Plan  1. Bronchitis-improved  -cont albuterol/mucinex as needed  -allergy meds daily 2. HTN  -DASH diet  -Lisinopril 10 mg daily  -recheck BP and BMP 2 weeks 3. Left shoulder pain  -cont prn pain meds  -offered PT, declines at this time  -no imaging ordered 4. Smoker  -cessation discussed  Return in about 2 weeks (around 03/27/2017). recheck BP and BMP. Routine health maintenance.  The patient was given clear instructions to go to ER or return to medical center if symptoms don't improve, worsen or new problems develop. The patient verbalized understanding. The patient was told to call to get lab results if they haven't heard anything in the next week.   Total time spent with patient was 18 min. Greater than 50 % of this visit was spent face to face  counseling and coordinating care regarding risk factor modification, compliance importance and encouragement, education related to high blood pressure.  This note has been created with Education officer, environmental. Any transcriptional errors are unintentional.    Scot Jun, PA-C [facility] [place], [place] [phone]   03/13/2017, 10:14 AM

## 2017-03-23 ENCOUNTER — Ambulatory Visit (INDEPENDENT_AMBULATORY_CARE_PROVIDER_SITE_OTHER): Payer: 59 | Admitting: Podiatry

## 2017-03-23 DIAGNOSIS — M722 Plantar fascial fibromatosis: Secondary | ICD-10-CM

## 2017-03-23 NOTE — Patient Instructions (Signed)

## 2017-03-23 NOTE — Progress Notes (Signed)
Patient presents for orthotic pick up.  Verbal and written break in and wear instructions given.  Patient will follow up in 4 weeks if symptoms worsen or fail to improve. 

## 2017-04-06 ENCOUNTER — Ambulatory Visit: Payer: 59 | Attending: Internal Medicine | Admitting: Internal Medicine

## 2017-04-06 ENCOUNTER — Encounter: Payer: Self-pay | Admitting: Internal Medicine

## 2017-04-06 VITALS — BP 140/98 | HR 54 | Temp 98.3°F | Resp 16 | Wt 191.0 lb

## 2017-04-06 DIAGNOSIS — Z801 Family history of malignant neoplasm of trachea, bronchus and lung: Secondary | ICD-10-CM | POA: Diagnosis not present

## 2017-04-06 DIAGNOSIS — N979 Female infertility, unspecified: Secondary | ICD-10-CM | POA: Insufficient documentation

## 2017-04-06 DIAGNOSIS — E785 Hyperlipidemia, unspecified: Secondary | ICD-10-CM | POA: Insufficient documentation

## 2017-04-06 DIAGNOSIS — Z6834 Body mass index (BMI) 34.0-34.9, adult: Secondary | ICD-10-CM | POA: Diagnosis not present

## 2017-04-06 DIAGNOSIS — Z87891 Personal history of nicotine dependence: Secondary | ICD-10-CM | POA: Insufficient documentation

## 2017-04-06 DIAGNOSIS — I10 Essential (primary) hypertension: Secondary | ICD-10-CM | POA: Insufficient documentation

## 2017-04-06 DIAGNOSIS — M25512 Pain in left shoulder: Secondary | ICD-10-CM | POA: Diagnosis not present

## 2017-04-06 DIAGNOSIS — Z803 Family history of malignant neoplasm of breast: Secondary | ICD-10-CM | POA: Diagnosis not present

## 2017-04-06 DIAGNOSIS — E669 Obesity, unspecified: Secondary | ICD-10-CM | POA: Insufficient documentation

## 2017-04-06 DIAGNOSIS — Z888 Allergy status to other drugs, medicaments and biological substances status: Secondary | ICD-10-CM | POA: Diagnosis not present

## 2017-04-06 DIAGNOSIS — Z8249 Family history of ischemic heart disease and other diseases of the circulatory system: Secondary | ICD-10-CM | POA: Insufficient documentation

## 2017-04-06 DIAGNOSIS — Z1231 Encounter for screening mammogram for malignant neoplasm of breast: Secondary | ICD-10-CM | POA: Diagnosis not present

## 2017-04-06 DIAGNOSIS — Z1211 Encounter for screening for malignant neoplasm of colon: Secondary | ICD-10-CM

## 2017-04-06 DIAGNOSIS — N951 Menopausal and female climacteric states: Secondary | ICD-10-CM | POA: Insufficient documentation

## 2017-04-06 DIAGNOSIS — Z1239 Encounter for other screening for malignant neoplasm of breast: Secondary | ICD-10-CM

## 2017-04-06 DIAGNOSIS — K625 Hemorrhage of anus and rectum: Secondary | ICD-10-CM

## 2017-04-06 MED ORDER — GABAPENTIN 100 MG PO CAPS: 200.0000 mg | ORAL_CAPSULE | Freq: Every day | ORAL | 3 refills | Status: DC

## 2017-04-06 MED ORDER — AMLODIPINE BESYLATE 5 MG PO TABS: 5.0000 mg | ORAL_TABLET | Freq: Every day | ORAL | 3 refills | Status: DC

## 2017-04-06 NOTE — Progress Notes (Signed)
Patient ID: Pleas PatriciaBrigitte Margaret Simonet, female    DOB: 06/03/1966  MRN: [id number]  CC: Establish Care   Subjective: [name] is a 51 y.o. female who presents for f/u visit and to est care with me. Her concerns today include: Hx of HL, tob dep, HTN Saw PA here 7/6 post ED visit for CP, cough and LT shoulder pain.  Dx with bronchitis and was doing better post ED visit. BP was elevated.  Started on Lisinopril.  1. HTN -stopped  Lisinopril after 1 wk because it caused headaches and upset stomach.  Also caused "bleeding from my butt."  Trail of stopping for 2 days then re-started.  Had Abdominal pain again. -no checking BP -trying to limit salt.  2. Rectal bleeding on with recent Lisinopril -no hemorroids and BM have been normal. -no fhx of colon CA. -never had colonscopy  3. Former smoker: quit 10 yrs ago  4. HM: needs MMG. Mom with breast CA in her  5950s. +hot flashes that are bother and intermittent. -no longer has periods. Stopped 6-8 mths ago  5. LT shoulder pain x 2 mths -worse with rotation and at nights.  -no known injury -lifts 15-20 lbs at work  6. Obesity: works 6 days wk, 10 hr shifts. -gets take outs to eat -no much exercise outside of work Patient Active Problem List   Diagnosis Date Noted  . Infertility, female, secondary 10/24/2011  . HYPERLIPIDEMIA 09/07/2009  . TOBACCO ABUSE 09/07/2009  . CONTACT DERMATITIS&OTHER ECZEMA DUE UNSPEC CAUSE 09/07/2009  . CHEST PAIN, ATYPICAL 09/07/2009     Current Outpatient Prescriptions on File Prior to Visit  Medication Sig Dispense Refill  . albuterol (PROVENTIL HFA;VENTOLIN HFA) 108 (90 BASE) MCG/ACT inhaler Inhale 2 puffs into the lungs every 6 (six) hours as needed for wheezing or shortness of breath. 1 Inhaler 1  . diclofenac (VOLTAREN) 75 MG EC tablet Take 1 tablet (75 mg total) by mouth 2 (two) times daily. (Patient not taking: Reported on 03/13/2017) 50 tablet 2  . ibuprofen (ADVIL,MOTRIN) 200 MG tablet Take  600-800 mg by mouth daily as needed (pain).    Marland Kitchen. loratadine (CLARITIN) 10 MG tablet Take 10 mg by mouth daily.    Marland Kitchen. triamcinolone (NASACORT ALLERGY 24HR) 55 MCG/ACT AERO nasal inhaler Place 2 sprays into the nose daily as needed (congestion).     No current facility-administered medications on file prior to visit.     Allergies  Allergen Reactions  . Lisinopril     Headache, upset stomach and rectal bleeding  . Milk-Related Compounds Other (See Comments)    Gi upset    Social History   Social History  . Marital status: Married    Spouse name: N/A  . Number of children: N/A  . Years of education: N/A   Occupational History  . Not on file.   Social History Main Topics  . Smoking status: Former Smoker    Quit date: 10/23/2009  . Smokeless tobacco: Never Used  . Alcohol use Yes     Comment: RARE  . Drug use: No  . Sexual activity: Yes   Other Topics Concern  . Not on file   Social History Narrative   Patient lives with her husband and works as an Sales executiveoptician.     Family History  Problem Relation Age of Onset  . Breast cancer Mother   . Cancer Mother   . Cancer Father        Unknown type.  Marland Kitchen. Heart  disease Father   . Cancer Brother        LUNG    Past Surgical History:  Procedure Laterality Date  . Other ORIF  2/09    Fracture of the radial head with  intraarticular extension and joint.    ROS: Review of Systems Negative except as stated above  PHYSICAL EXAM: BP (!) 155/108   Pulse (!) 54   Temp 98.3 F (36.8 C) (Oral)   Resp 16   Wt 191 lb (86.6 kg)   SpO2 98%   BMI 34.93 kg/m   140/98 Wt Readings from Last 3 Encounters:  04/06/17 191 lb (86.6 kg)  03/13/17 193 lb 12.8 oz (87.9 kg)  03/05/17 195 lb (88.5 kg)   Physical Exam  General appearance - alert, well appearing, Overweight middle-age Caucasian female and in no distress Mental status - alert, oriented to person, place, and time, normal mood, behavior, speech, dress, motor activity, and  thought processes Eyes - pupils equal and reactive, extraocular eye movements intact Mouth - mucous membranes moist, pharynx normal without lesions Neck - supple, no significant adenopathy Chest - clear to auscultation, no wheezes, rales or rhonchi, symmetric air entry Heart - normal rate, regular rhythm, normal S1, S2, no murmurs, rubs, clicks or gallops Musculoskeletal -LT shoulder:  Mild tenderness on palpation of anterior and posterior joint. Discomfort with active and passive elevation of the arm to about 50. Drop arm test positive Extremities - peripheral pulses normal, no pedal edema, no clubbing or cyanosis  Depression screen PHQ 2/9 04/06/2017  Decreased Interest 2  Down, Depressed, Hopeless 0  PHQ - 2 Score 2  Altered sleeping 1  Tired, decreased energy 1  Change in appetite 0  Feeling bad or failure about yourself  0  Trouble concentrating 1  Moving slowly or fidgety/restless 0  Suicidal thoughts 0  PHQ-9 Score 5   GAD 7 : Generalized Anxiety Score 04/06/2017 03/13/2017  Nervous, Anxious, on Edge 1 0  Control/stop worrying 0 0  Worry too much - different things 0 0  Trouble relaxing 1 0  Restless 0 0  Easily annoyed or irritable 1 1  Afraid - awful might happen 0 0  Total GAD 7 Score 3 1   ASSESSMENT AND PLAN: 1. Essential hypertension -Change lisinopril to amlodipine DASH diet discussed - amLODipine (NORVASC) 5 MG tablet; Take 1 tablet (5 mg total) by mouth daily.  Dispense: 90 tablet; Refill: 3  2. Rectal bleeding 3. Colon cancer screening - Ambulatory referral to Gastroenterology  4. Former smoker -Commended her and encourage her to remain tobacco free  5. Hot flashes due to menopause -Discussed options to help decrease hot flashes including use of HRT, SSRIs, alternatives/natural remedies. Went over risks and benefits of each. With family history of breast cancer she wanted to avoid HRT for now. We discussed [facility] but her insurance would not cover this.  Other option was low dose gabapentin at bedtime off label use. Patient is willing to give this a try and I think it will also help some with shoulder pain. Patient advised that the medication can cause drowsiness - gabapentin (NEURONTIN) 100 MG capsule; Take 2 capsules (200 mg total) by mouth at bedtime.  Dispense: 60 capsule; Refill: 3  6. Breast cancer screening - MM Digital Screening; Future  7. Acute pain of left shoulder Likely rotator cuff tendinitis or other cuff pathology - DG Shoulder Left; Future - Ambulatory referral to Orthopedic Surgery  8. Obesity (BMI 30.0-34.9) -discussed ways  that she can still make good food choices when eating takeouts like sandwiches on wheat bread -encouraged to cook more -advise regular aerobic exercise at least 150 minutes a week   PHQ9 and GAD 7 reviewed. Pt denies any issues with depression and anxiety at this time.   Patient was given the opportunity to ask questions.  Patient verbalized understanding of the plan and was able to repeat key elements of the plan.   Orders Placed This Encounter  Procedures  . MM Digital Screening  . DG Shoulder Left  . Ambulatory referral to Orthopedic Surgery  . Ambulatory referral to Gastroenterology     Requested Prescriptions   Signed Prescriptions Disp Refills  . amLODipine (NORVASC) 5 MG tablet 90 tablet 3    Sig: Take 1 tablet (5 mg total) by mouth daily.  Marland Kitchen. gabapentin (NEURONTIN) 100 MG capsule 60 capsule 3    Sig: Take 2 capsules (200 mg total) by mouth at bedtime.    Return in about 2 weeks (around 04/20/2017) for pap.  Jonah Blueeborah Johnson, MD, FACP

## 2017-04-06 NOTE — Patient Instructions (Signed)
Stop Lisinopril.  Start Amlodipine instead for blood pressure. Try to limit salt in foods.    Start Gabapentin at bedtime to help with chronic pain and hot flashes.    Follow a Healthy Eating Plan - You can do it! Limit sugary drinks.  Avoid sodas, sweet tea, sport or energy drinks, or fruit drinks.  Drink water, lo-fat milk, or diet drinks. Limit snack foods.   Cut back on candy, cake, cookies, chips, ice cream.  These are a special treat, only in small amounts. Eat plenty of vegetables.  Especially dark green, red, and orange vegetables. Aim for at least 3 servings a day. More is better! Include fruit in your daily diet.  Whole fruit is much healthier than fruit juice! Limit "white" bread, "white" pasta, "white" rice.   Choose "100% whole grain" products, brown or wild rice. Avoid fatty meats. Try "Meatless Monday" and choose eggs or beans one day a week.  When eating meat, choose lean meats like chicken, [place]key, and fish.  Grill, broil, or bake meats instead of frying, and eat poultry without the skin. Eat less salt.  Avoid frozen pizzas, frozen dinners and salty foods.  Use seasonings other than salt in cooking.  This can help blood pressure and keep you from swelling Beer, wine and liquor have calories.  If you can safely drink alcohol, limit to 1 drink per day for women, 2 drinks for men  Menopause Menopause is the normal time of life when menstrual periods stop completely. Menopause is complete when you have missed 12 consecutive menstrual periods. It usually occurs between the ages of 48 years and 55 years. Very rarely does a woman develop menopause before the age of 40 years. At menopause, your ovaries stop producing the female hormones estrogen and progesterone. This can cause undesirable symptoms and also affect your health. Sometimes the symptoms may occur 4-5 years before the menopause begins. There is no relationship between menopause and:  Oral contraceptives.  Number of children  you had.  Race.  The age your menstrual periods started (menarche).  Heavy smokers and very thin women may develop menopause earlier in life. What are the causes?  The ovaries stop producing the female hormones estrogen and progesterone. Other causes include:  Surgery to remove both ovaries.  The ovaries stop functioning for no known reason.  Tumors of the pituitary gland in the brain.  Medical disease that affects the ovaries and hormone production.  Radiation treatment to the abdomen or pelvis.  Chemotherapy that affects the ovaries.  What are the signs or symptoms?  Hot flashes.  Night sweats.  Decrease in sex drive.  Vaginal dryness and thinning of the vagina causing painful intercourse.  Dryness of the skin and developing wrinkles.  Headaches.  Tiredness.  Irritability.  Memory problems.  Weight gain.  Bladder infections.  Hair growth of the face and chest.  Infertility. More serious symptoms include:  Loss of bone (osteoporosis) causing breaks (fractures).  Depression.  Hardening and narrowing of the arteries (atherosclerosis) causing heart attacks and strokes.  How is this diagnosed?  When the menstrual periods have stopped for 12 straight months.  Physical exam.  Hormone studies of the blood. How is this treated? There are many treatment choices and nearly as many questions about them. The decisions to treat or not to treat menopausal changes is an individual choice made with your health care provider. Your health care provider can discuss the treatments with you. Together, you can decide which treatment will  work best for you. Your treatment choices may include:  Hormone therapy (estrogen and progesterone).  Non-hormonal medicines.  Treating the individual symptoms with medicine (for example antidepressants for depression).  Herbal medicines that may help specific symptoms.  Counseling by a psychiatrist or psychologist.  Group  therapy.  Lifestyle changes including: ? Eating healthy. ? Regular exercise. ? Limiting caffeine and alcohol. ? Stress management and meditation.  No treatment.  Follow these instructions at home:  Take the medicine your health care provider gives you as directed.  Get plenty of sleep and rest.  Exercise regularly.  Eat a diet that contains calcium (good for the bones) and soy products (acts like estrogen hormone).  Avoid alcoholic beverages.  Do not smoke.  If you have hot flashes, dress in layers.  Take supplements, calcium, and vitamin D to strengthen bones.  You can use over-the-counter lubricants or moisturizers for vaginal dryness.  Group therapy is sometimes very helpful.  Acupuncture may be helpful in some cases. Contact a health care provider if:  You are not sure you are in menopause.  You are having menopausal symptoms and need advice and treatment.  You are still having menstrual periods after age 53 years.  You have pain with intercourse.  Menopause is complete (no menstrual period for 12 months) and you develop vaginal bleeding.  You need a referral to a specialist (gynecologist, psychiatrist, or psychologist) for treatment. Get help right away if:  You have severe depression.  You have excessive vaginal bleeding.  You fell and think you have a broken bone.  You have pain when you urinate.  You develop leg or chest pain.  You have a fast pounding heart beat (palpitations).  You have severe headaches.  You develop vision problems.  You feel a lump in your breast.  You have abdominal pain or severe indigestion. This information is not intended to replace advice given to you by your health care provider. Make sure you discuss any questions you have with your health care provider. Document Released: 11/12/2003 Document Revised: 01/28/2016 Document Reviewed: 03/21/2013 Elsevier Interactive Patient Education  2017 [employer]

## 2017-04-11 ENCOUNTER — Encounter: Payer: Self-pay | Admitting: Internal Medicine

## 2017-04-25 ENCOUNTER — Other Ambulatory Visit: Payer: 59 | Admitting: Internal Medicine

## 2017-05-09 ENCOUNTER — Other Ambulatory Visit: Payer: 59 | Admitting: Internal Medicine

## 2017-07-07 ENCOUNTER — Encounter (HOSPITAL_COMMUNITY): Payer: Self-pay | Admitting: Emergency Medicine

## 2017-07-07 ENCOUNTER — Ambulatory Visit (HOSPITAL_COMMUNITY)
Admission: EM | Admit: 2017-07-07 | Discharge: 2017-07-07 | Disposition: A | Discharge status: Home / Self Care | Payer: 59 | Attending: Internal Medicine | Admitting: Internal Medicine

## 2017-07-07 DIAGNOSIS — J00 Acute nasopharyngitis [common cold]: Secondary | ICD-10-CM | POA: Diagnosis not present

## 2017-07-07 DIAGNOSIS — J45909 Unspecified asthma, uncomplicated: Secondary | ICD-10-CM

## 2017-07-07 MED ORDER — BENZONATATE 100 MG PO CAPS: 100.0000 mg | ORAL_CAPSULE | Freq: Three times a day (TID) | ORAL | 0 refills | Status: DC

## 2017-07-07 MED ORDER — CETIRIZINE-PSEUDOEPHEDRINE ER 5-120 MG PO TB12: 1.0000 | ORAL_TABLET | Freq: Every day | ORAL | 0 refills | Status: DC

## 2017-07-07 MED ORDER — ALBUTEROL SULFATE HFA 108 (90 BASE) MCG/ACT IN AERS: 2.0000 | INHALATION_SPRAY | Freq: Four times a day (QID) | RESPIRATORY_TRACT | 0 refills | Status: DC | PRN

## 2017-07-07 MED ORDER — FLUTICASONE PROPIONATE 50 MCG/ACT NA SUSP: 2.0000 | Freq: Every day | NASAL | 0 refills | Status: DC

## 2017-07-07 NOTE — Discharge Instructions (Signed)
Tessalon for cough. Start flonase, zyrtec-D for nasal congestion. You can use over the counter nasal saline rinse such as neti pot for nasal congestion. Keep hydrated, your urine should be clear to pale yellow in color. Tylenol/motrin for fever and pain. Monitor for any worsening of symptoms, chest pain, shortness of breath, wheezing, swelling of the throat, follow up for reevaluation.  ° °For sore throat try using a honey-based tea. Use 3 teaspoons of honey with juice squeezed from half lemon. Place shaved pieces of ginger into 1/2-1 cup of water and warm over stove top. Then mix the ingredients and repeat every 4 hours as needed. ° °

## 2017-07-07 NOTE — ED Triage Notes (Signed)
Pt c/o cold sx onset: yest    Sx include: prod cough, ST, bilateral clogged ears, facial pressure, sneezing, nausea  Denies: fevers  Taking: OTC cold meds w/temp relief.   A&O x4... NAD

## 2017-07-07 NOTE — ED Provider Notes (Signed)
[facility]    CSN: [id number] Arrival date & time: 07/07/17  1000     History   Chief Complaint Chief Complaint  Patient presents with  . URI    HPI [name] is a 51 y.o. female.   51 year old female with history of DDD, HLD, HTN comes in for 1 day history of URI symptoms. She has productive cough, sore throat, bilateral clogged ears, facial pressure, sneezing, nausea. Denies fever, chills, night sweats. Denies chest pain. Has shortness of breath at baseline, states no worsening. otc theraflu with temp relief. States uses albuterol inhaler for asthma, which she does not use often. She has not needed to use it since symptoms onset.       Past Medical History:  Diagnosis Date  . Allergy   . Anemia   . Atypical chest pain 1/11   Pain is not reproducible with exertion.   . Degenerative disk disease    No radiologic evicence in EMR  . Hyperlipidemia   . Tobacco abuse     Patient Active Problem List   Diagnosis Date Noted  . Former smoker 04/06/2017  . Obesity (BMI 30.0-34.9) 04/06/2017  . Essential hypertension 04/06/2017  . Hot flashes due to menopause 04/06/2017  . Acute pain of left shoulder 04/06/2017  . Infertility, female, secondary 10/24/2011  . HYPERLIPIDEMIA 09/07/2009    Past Surgical History:  Procedure Laterality Date  . Other ORIF  2/09    Fracture of the radial head with  intraarticular extension and joint.    OB History    No data available       Home Medications    Prior to Admission medications   Medication Sig Start Date End Date Taking? Authorizing Provider  amLODipine (NORVASC) 5 MG tablet Take 1 tablet (5 mg total) by mouth daily. 04/06/17  Yes Marcine Matar, MD  diclofenac (VOLTAREN) 75 MG EC tablet Take 1 tablet (75 mg total) by mouth 2 (two) times daily. 11/09/16  Yes Regal, Kirstie Peri, DPM  gabapentin (NEURONTIN) 100 MG capsule Take 2 capsules (200 mg total) by mouth at bedtime. 04/06/17  Yes Marcine Matar, MD  ibuprofen (ADVIL,MOTRIN) 200 MG tablet Take 600-800 mg by mouth daily as needed (pain).   Yes [provider]  loratadine (CLARITIN) 10 MG tablet Take 10 mg by mouth daily.   Yes [provider]  triamcinolone (NASACORT ALLERGY 24HR) 55 MCG/ACT AERO nasal inhaler Place 2 sprays into the nose daily as needed (congestion).   Yes [provider]  albuterol (PROVENTIL HFA;VENTOLIN HFA) 108 (90 Base) MCG/ACT inhaler Inhale 2 puffs into the lungs every 6 (six) hours as needed for wheezing or shortness of breath. 07/07/17   Cathie Hoops, Khalie Wince V, PA-C  benzonatate (TESSALON) 100 MG capsule Take 1 capsule (100 mg total) by mouth every 8 (eight) hours. 07/07/17   Cathie Hoops, Lai Hendriks V, PA-C  cetirizine-pseudoephedrine (ZYRTEC-D) 5-120 MG tablet Take 1 tablet by mouth daily. 07/07/17   Cathie Hoops, Lucian Baswell V, PA-C  fluticasone (FLONASE) 50 MCG/ACT nasal spray Place 2 sprays into both nostrils daily. 07/07/17   Belinda Fisher, PA-C    Family History Family History  Problem Relation Age of Onset  . Breast cancer Mother   . Cancer Mother   . Cancer Father        Unknown type.  Marland Kitchen Heart disease Father   . Cancer Brother        LUNG    Social History Social History  Substance Use Topics  . Smoking status: Former Smoker    Quit date: 10/23/2009  . Smokeless tobacco: Never Used  . Alcohol use Yes     Comment: RARE     Allergies   Lisinopril and Milk-related compounds   Review of Systems Review of Systems  Reason unable to perform ROS: See HPI as above.     Physical Exam Triage Vital Signs ED Triage Vitals [07/07/17 1011]  Enc Vitals Group     BP (!) 153/103     Pulse Rate 95     Resp 20     Temp 98.5 F (36.9 C)     Temp Source Oral     SpO2 95 %     Weight      Height      Head Circumference      Peak Flow      Pain Score      Pain Loc      Pain Edu?      Excl. in GC?    No data found.   Updated Vital Signs BP (!) 153/103 (BP Location: Right Arm)   Pulse 95   Temp 98.5  F (36.9 C) (Oral)   Resp 20   LMP 06/26/2013   SpO2 95%   Physical Exam  Constitutional: She is oriented to person, place, and time. She appears well-developed and well-nourished. No distress.  HENT:  Head: Normocephalic and atraumatic.  Right Ear: Tympanic membrane, external ear and ear canal normal. Tympanic membrane is not erythematous and not bulging.  Left Ear: Tympanic membrane, external ear and ear canal normal. Tympanic membrane is not erythematous and not bulging.  Nose: Nose normal. Right sinus exhibits no maxillary sinus tenderness and no frontal sinus tenderness. Left sinus exhibits no maxillary sinus tenderness and no frontal sinus tenderness.  Mouth/Throat: Uvula is midline, oropharynx is clear and moist and mucous membranes are normal.  Eyes: Pupils are equal, round, and reactive to light. Conjunctivae are normal.  Neck: Normal range of motion. Neck supple.  Cardiovascular: Normal rate, regular rhythm and normal heart sounds.  Exam reveals no gallop and no friction rub.   No murmur heard. Pulmonary/Chest: Effort normal and breath sounds normal. She has no decreased breath sounds. She has no wheezes. She has no rhonchi. She has no rales.  Lymphadenopathy:    She has no cervical adenopathy.  Neurological: She is alert and oriented to person, place, and time.  Skin: Skin is warm and dry.  Psychiatric: She has a normal mood and affect. Her behavior is normal. Judgment normal.     UC Treatments / Results  Labs (all labs ordered are listed, but only abnormal results are displayed) Labs Reviewed - No data to display  EKG  EKG Interpretation None       Radiology No results found.  Procedures Procedures (including critical care time)  Medications Ordered in UC Medications - No data to display   Initial Impression / Assessment and Plan / UC Course  I have reviewed the triage vital signs and the nursing notes.  Pertinent labs & imaging results that were  available during my care of the patient were reviewed by me and considered in my medical decision making (see chart for details).    Discussed with patient history and exam most consistent with viral URI. Symptomatic treatment as needed. Push fluids. Return precautions given.   Final Clinical Impressions(s) / UC Diagnoses   Final diagnoses:  Acute nasopharyngitis    New  Prescriptions New Prescriptions   BENZONATATE (TESSALON) 100 MG CAPSULE    Take 1 capsule (100 mg total) by mouth every 8 (eight) hours.   CETIRIZINE-PSEUDOEPHEDRINE (ZYRTEC-D) 5-120 MG TABLET    Take 1 tablet by mouth daily.   FLUTICASONE (FLONASE) 50 MCG/ACT NASAL SPRAY    Place 2 sprays into both nostrils daily.      Belinda FisherYu, Annete Ayuso V, PA-C 07/07/17 1036

## 2018-01-23 DIAGNOSIS — Z79899 Other long term (current) drug therapy: Secondary | ICD-10-CM | POA: Diagnosis not present

## 2018-01-23 DIAGNOSIS — W57XXXA Bitten or stung by nonvenomous insect and other nonvenomous arthropods, initial encounter: Secondary | ICD-10-CM | POA: Diagnosis not present

## 2018-01-23 DIAGNOSIS — R509 Fever, unspecified: Secondary | ICD-10-CM | POA: Diagnosis not present

## 2018-01-23 DIAGNOSIS — L03116 Cellulitis of left lower limb: Secondary | ICD-10-CM | POA: Diagnosis not present

## 2018-01-23 DIAGNOSIS — Z7689 Persons encountering health services in other specified circumstances: Secondary | ICD-10-CM | POA: Diagnosis not present

## 2019-11-15 ENCOUNTER — Other Ambulatory Visit: Payer: Self-pay

## 2019-11-15 ENCOUNTER — Ambulatory Visit: Payer: Self-pay

## 2019-11-15 ENCOUNTER — Other Ambulatory Visit: Payer: Self-pay | Admitting: Family Medicine

## 2019-11-15 DIAGNOSIS — M25532 Pain in left wrist: Secondary | ICD-10-CM

## 2019-11-15 DIAGNOSIS — M25512 Pain in left shoulder: Secondary | ICD-10-CM

## 2019-12-25 ENCOUNTER — Telehealth: Payer: Self-pay | Admitting: Unknown Physician Specialty

## 2019-12-25 ENCOUNTER — Encounter (HOSPITAL_COMMUNITY): Payer: Self-pay | Admitting: Emergency Medicine

## 2019-12-25 ENCOUNTER — Other Ambulatory Visit: Payer: Self-pay

## 2019-12-25 ENCOUNTER — Emergency Department (HOSPITAL_COMMUNITY): Payer: No Typology Code available for payment source

## 2019-12-25 ENCOUNTER — Emergency Department (HOSPITAL_COMMUNITY)
Admission: EM | Admit: 2019-12-25 | Discharge: 2019-12-25 | Disposition: A | Discharge status: Home / Self Care | Payer: No Typology Code available for payment source | Attending: Emergency Medicine | Admitting: Emergency Medicine

## 2019-12-25 DIAGNOSIS — R05 Cough: Secondary | ICD-10-CM | POA: Diagnosis not present

## 2019-12-25 DIAGNOSIS — I10 Essential (primary) hypertension: Secondary | ICD-10-CM | POA: Insufficient documentation

## 2019-12-25 DIAGNOSIS — R509 Fever, unspecified: Secondary | ICD-10-CM | POA: Diagnosis not present

## 2019-12-25 DIAGNOSIS — R5383 Other fatigue: Secondary | ICD-10-CM | POA: Diagnosis present

## 2019-12-25 DIAGNOSIS — J189 Pneumonia, unspecified organism: Secondary | ICD-10-CM | POA: Insufficient documentation

## 2019-12-25 DIAGNOSIS — Z87891 Personal history of nicotine dependence: Secondary | ICD-10-CM | POA: Diagnosis not present

## 2019-12-25 DIAGNOSIS — U071 COVID-19: Secondary | ICD-10-CM | POA: Insufficient documentation

## 2019-12-25 DIAGNOSIS — R0602 Shortness of breath: Secondary | ICD-10-CM | POA: Diagnosis not present

## 2019-12-25 DIAGNOSIS — Z79899 Other long term (current) drug therapy: Secondary | ICD-10-CM | POA: Insufficient documentation

## 2019-12-25 LAB — CBC WITH DIFFERENTIAL/PLATELET
Abs Immature Granulocytes: 0.02 10*3/uL (ref 0.00–0.07)
Basophils Absolute: 0 10*3/uL (ref 0.0–0.1)
Basophils Relative: 0 %
Eosinophils Absolute: 0.1 10*3/uL (ref 0.0–0.5)
Eosinophils Relative: 2 %
HCT: 37.9 % (ref 36.0–46.0)
Hemoglobin: 12.6 g/dL (ref 12.0–15.0)
Immature Granulocytes: 0 %
Lymphocytes Relative: 26 %
Lymphs Abs: 1.6 10*3/uL (ref 0.7–4.0)
MCH: 30.2 pg (ref 26.0–34.0)
MCHC: 33.2 g/dL (ref 30.0–36.0)
MCV: 90.9 fL (ref 80.0–100.0)
Monocytes Absolute: 0.8 10*3/uL (ref 0.1–1.0)
Monocytes Relative: 13 %
Neutro Abs: 3.4 10*3/uL (ref 1.7–7.7)
Neutrophils Relative %: 59 %
Platelets: 303 10*3/uL (ref 150–400)
RBC: 4.17 MIL/uL (ref 3.87–5.11)
RDW: 12.5 % (ref 11.5–15.5)
WBC: 5.9 10*3/uL (ref 4.0–10.5)
nRBC: 0 % (ref 0.0–0.2)

## 2019-12-25 LAB — COMPREHENSIVE METABOLIC PANEL
ALT: 32 U/L (ref 0–44)
AST: 26 U/L (ref 15–41)
Albumin: 3.6 g/dL (ref 3.5–5.0)
Alkaline Phosphatase: 70 U/L (ref 38–126)
Anion gap: 14 (ref 5–15)
BUN: 17 mg/dL (ref 6–20)
CO2: 24 mmol/L (ref 22–32)
Calcium: 8.7 mg/dL — ABNORMAL LOW (ref 8.9–10.3)
Chloride: 96 mmol/L — ABNORMAL LOW (ref 98–111)
Creatinine, Ser: 0.75 mg/dL (ref 0.44–1.00)
GFR calc Af Amer: >60 mL/min (ref >60)
GFR calc non Af Amer: >60 mL/min (ref >60)
Glucose, Bld: 113 mg/dL — ABNORMAL HIGH (ref 70–99)
Potassium: 3.9 mmol/L (ref 3.5–5.1)
Sodium: 134 mmol/L — ABNORMAL LOW (ref 135–145)
Total Bilirubin: 0.4 mg/dL (ref 0.3–1.2)
Total Protein: 7.2 g/dL (ref 6.5–8.1)

## 2019-12-25 LAB — URINALYSIS, ROUTINE W REFLEX MICROSCOPIC
Bilirubin Urine: NEGATIVE
Glucose, UA: NEGATIVE mg/dL
Hgb urine dipstick: NEGATIVE
Ketones, ur: NEGATIVE mg/dL
Leukocytes,Ua: NEGATIVE
Nitrite: NEGATIVE
Protein, ur: 30 mg/dL — AB
Specific Gravity, Urine: 1.025 (ref 1.005–1.030)
pH: 5 (ref 5.0–8.0)

## 2019-12-25 MED ORDER — SODIUM CHLORIDE 0.9 % IV BOLUS (SEPSIS)
1000.0000 mL | Freq: Once | INTRAVENOUS | Status: AC
  Administered 2019-12-25: 1000 mL via INTRAVENOUS

## 2019-12-25 MED ORDER — SODIUM CHLORIDE 0.9 % IV SOLN
1.0000 g | Freq: Once | INTRAVENOUS | Status: AC
  Administered 2019-12-25: 1 g via INTRAVENOUS
  Filled 2019-12-25: qty 10

## 2019-12-25 MED ORDER — ACETAMINOPHEN 325 MG PO TABS
650.0000 mg | ORAL_TABLET | Freq: Once | ORAL | Status: AC
Start: 2019-12-25 — End: 2019-12-25
  Administered 2019-12-25: 650 mg via ORAL
  Filled 2019-12-25: qty 2

## 2019-12-25 MED ORDER — AZITHROMYCIN 250 MG PO TABS: 250.0000 mg | ORAL_TABLET | Freq: Every day | ORAL | 0 refills | Status: DC

## 2019-12-25 MED ORDER — AZITHROMYCIN 250 MG PO TABS
500.0000 mg | ORAL_TABLET | Freq: Once | ORAL | Status: AC
  Administered 2019-12-25: 500 mg via ORAL
  Filled 2019-12-25: qty 2

## 2019-12-25 NOTE — ED Provider Notes (Signed)
9:18 AM Care assumed from Dr. Bebe Shaggy.  At time of transfer care, patient is awaiting reassessment after antibiotics and fluids finish to reassess to make sure she is not lightheaded prior to discharge home with Covid diagnosis and outpatient antibiotics for possible pneumonia.  If patient is able to eat and drink and ambulate safely, anticipate discharge.  11:10 AM After antibiotics and fluids, patient is feeling better.  She was able to ambulate around the room safely without near syncope or syncope.  Patient would like to go home.  Patient we discharged with antibiotics for treatment of her pneumonia and COVID-19 infection.  Patient understand return precautions and follow-up instructions.  Patient discharged in good condition with improved symptoms.  Clinical Impression: 1. COVID-19   2. Community acquired pneumonia of left lower lobe of lung     Disposition: Discharge  Condition: Good  I have discussed the results, Dx and Tx plan with the pt(& family if present). He/she/they expressed understanding and agree(s) with the plan. Discharge instructions discussed at great length. Strict return precautions discussed and pt &/or family have verbalized understanding of the instructions. No further questions at time of discharge.    New Prescriptions   AZITHROMYCIN (ZITHROMAX) 250 MG TABLET    Take 1 tablet (250 mg total) by mouth daily.    Follow Up: Marcine Matar, MD [address] [place] [address] [phone]     [facility] EMERGENCY DEPARTMENT [address] [id number] mc [place] [address] [phone]        Lum Stillinger, Canary Brim, MD 12/25/19 1116

## 2019-12-25 NOTE — ED Notes (Signed)
Ambulated pt to bathroom maintained spo2 95% on room air.

## 2019-12-25 NOTE — ED Triage Notes (Signed)
Pt brought to ED by [facility] from home for c/o increase fatigue, nausea and cough for a week. Covid + since last Friday. HR 108, BP 130/90, R 16, SPO2 97% RA.

## 2019-12-25 NOTE — ED Provider Notes (Signed)
[facility] EMERGENCY DEPARTMENT Provider Note   CSN: [id number] Arrival date & time: 12/25/19  0309     History Chief Complaint  Patient presents with  . Fatigue    [name] is a 54 y.o. female.  The history is provided by the patient.  Cough Severity:  Moderate Onset quality:  Gradual Duration:  5 days Timing:  Intermittent Progression:  Worsening Relieved by:  Nothing Worsened by:  Nothing Associated symptoms: chills, fever and shortness of breath   Patient with history of asthma presents with cough, chills, fatigue and shortness of breath.  Patient was diagnosed with COVID-19 on April 16.  Since that time her cough and fatigue is worsened.  Earlier in the night she became extremely lightheaded upon standing.  No vomiting or diarrhea     Past Medical History:  Diagnosis Date  . Allergy   . Anemia   . Atypical chest pain 1/11   Pain is not reproducible with exertion.   . Degenerative disk disease    No radiologic evicence in EMR  . Hyperlipidemia   . Tobacco abuse     Patient Active Problem List   Diagnosis Date Noted  . Former smoker 04/06/2017  . Obesity (BMI 30.0-34.9) 04/06/2017  . Essential hypertension 04/06/2017  . Hot flashes due to menopause 04/06/2017  . Acute pain of left shoulder 04/06/2017  . Infertility, female, secondary 10/24/2011  . HYPERLIPIDEMIA 09/07/2009    Past Surgical History:  Procedure Laterality Date  . Other ORIF  2/09    Fracture of the radial head with  intraarticular extension and joint.     OB History   No obstetric history on file.     Family History  Problem Relation Age of Onset  . Breast cancer Mother   . Cancer Mother   . Cancer Father        Unknown type.  Marland Kitchen Heart disease Father   . Cancer Brother        LUNG    Social History   Tobacco Use  . Smoking status: Former Smoker    Quit date: 10/23/2009    Years since quitting: 10.1  . Smokeless tobacco: Never Used    Substance Use Topics  . Alcohol use: Yes    Comment: RARE  . Drug use: No    Home Medications Prior to Admission medications   Medication Sig Start Date End Date Taking? Authorizing Provider  albuterol (PROVENTIL HFA;VENTOLIN HFA) 108 (90 Base) MCG/ACT inhaler Inhale 2 puffs into the lungs every 6 (six) hours as needed for wheezing or shortness of breath. 07/07/17   Cathie Hoops, Amy V, PA-C  amLODipine (NORVASC) 5 MG tablet Take 1 tablet (5 mg total) by mouth daily. 04/06/17   Marcine Matar, MD  benzonatate (TESSALON) 100 MG capsule Take 1 capsule (100 mg total) by mouth every 8 (eight) hours. 07/07/17   Cathie Hoops, Amy V, PA-C  cetirizine-pseudoephedrine (ZYRTEC-D) 5-120 MG tablet Take 1 tablet by mouth daily. 07/07/17   Cathie Hoops, Amy V, PA-C  diclofenac (VOLTAREN) 75 MG EC tablet Take 1 tablet (75 mg total) by mouth 2 (two) times daily. 11/09/16   Lenn Sink, DPM  fluticasone (FLONASE) 50 MCG/ACT nasal spray Place 2 sprays into both nostrils daily. 07/07/17   Cathie Hoops, Amy V, PA-C  gabapentin (NEURONTIN) 100 MG capsule Take 2 capsules (200 mg total) by mouth at bedtime. 04/06/17   Marcine Matar, MD  ibuprofen (ADVIL,MOTRIN) 200 MG tablet Take 600-800 mg by  mouth daily as needed (pain).    [provider]  loratadine (CLARITIN) 10 MG tablet Take 10 mg by mouth daily.    [provider]  triamcinolone (NASACORT ALLERGY 24HR) 55 MCG/ACT AERO nasal inhaler Place 2 sprays into the nose daily as needed (congestion).    [provider]    Allergies    Lisinopril and Milk-related compounds  Review of Systems   Review of Systems  Constitutional: Positive for chills and fever.  Respiratory: Positive for cough and shortness of breath.   Cardiovascular:       Chest pain with cough  Gastrointestinal: Negative for vomiting.  All other systems reviewed and are negative.   Physical Exam Updated Vital Signs BP 125/84   Pulse (!) 119   Temp 100.1 F (37.8 C)   Resp 20   Ht 1.575 m (5'  2")   Wt 86.6 kg   LMP 06/26/2013   SpO2 97%   BMI 34.92 kg/m   Physical Exam CONSTITUTIONAL: Well developed/well nourished HEAD: Normocephalic/atraumatic EYES: EOMI/PERRL ENMT: Mucous membranes moist NECK: supple no meningeal signs SPINE/BACK:entire spine nontender CV: S1/S2 noted, no murmurs/rubs/gallops noted LUNGS: Scattered coarse breath sounds no apparent distress ABDOMEN: soft, nontender, no rebound or guarding, bowel sounds noted throughout abdomen GU:no cva tenderness NEURO: Pt is awake/alert/appropriate, moves all extremitiesx4.  No facial droop.  EXTREMITIES: pulses normal/equal, full ROM SKIN: warm, color normal PSYCH: no abnormalities of mood noted, alert and oriented to situation  ED Results / Procedures / Treatments   Labs (all labs ordered are listed, but only abnormal results are displayed) Labs Reviewed  COMPREHENSIVE METABOLIC PANEL - Abnormal; Notable for the following components:      Result Value   Sodium 134 (*)    Chloride 96 (*)    Glucose, Bld 113 (*)    Calcium 8.7 (*)    All other components within normal limits  URINALYSIS, ROUTINE W REFLEX MICROSCOPIC - Abnormal; Notable for the following components:   APPearance HAZY (*)    Protein, ur 30 (*)    Bacteria, UA RARE (*)    All other components within normal limits  CBC WITH DIFFERENTIAL/PLATELET      EKG EKG Interpretation  Date/Time:  Wednesday December 25 2019 06:33:35 EDT Ventricular Rate:  99 PR Interval:    QRS Duration: 100 QT Interval:  328 QTC Calculation: 421 R Axis:   20 Text Interpretation: Sinus rhythm Borderline repolarization abnormality Confirmed by Ripley Fraise [phone]) on 12/25/2019 6:45:33 AM   Radiology DG Chest Port 1 View  Result Date: 12/25/2019 CLINICAL DATA:  Cough. COVID positive. EXAM: PORTABLE CHEST 1 VIEW COMPARISON:  Two-view chest x-ray 03/05/2017 FINDINGS: The heart size is normal. New left lower lobe airspace disease is present. Upper lobes are  clear bilaterally. Right lung is clear. IMPRESSION: New left lower lobe airspace disease compatible with pneumonia. Electronically Signed   By: San Morelle M.D.   On: 12/25/2019 06:32    Procedures Procedures    Medications Ordered in ED Medications  cefTRIAXone (ROCEPHIN) 1 g in sodium chloride 0.9 % 100 mL IVPB (has no administration in time range)  azithromycin (ZITHROMAX) tablet 500 mg (has no administration in time range)  sodium chloride 0.9 % bolus 1,000 mL (1,000 mLs Intravenous New Bag/Given 12/25/19 0635)  acetaminophen (TYLENOL) tablet 650 mg (650 mg Oral Given 12/25/19 3710)    ED Course  I have reviewed the triage vital signs and the nursing notes.  Pertinent labs & imaging results  that were available during my care of the patient were reviewed by me and considered in my medical decision making (see chart for details).    MDM Rules/Calculators/A&P                      7:19 AM Patient diagnosed with COVID-19 on April 16 now with cough, fatigue and shortness of breath.  No hypoxia.  X-ray reveals focal infiltrate. Suspect patient's COVID is worsening.  She may also be feeling worse with secondary bacterial pneumonia. I have reached out to pulmonology to see if she qualifies for monoclonal antibody infusions In the meantime, due to focal infiltrate we will treat for bacterial pneumonia. Will sign out to Dr. Rush Landmark, to reassess patient and if can ambulate without weakness she can be discharged   Jaselle Yoshino Broccoli was evaluated in Emergency Department on 12/25/2019 for the symptoms described in the history of present illness. She was evaluated in the context of the global COVID-19 pandemic, which necessitated consideration that the patient might be at risk for infection with the SARS-CoV-2 virus that causes COVID-19. Institutional protocols and algorithms that pertain to the evaluation of patients at risk for COVID-19 are in a state of rapid change based on  information released by regulatory bodies including the CDC and federal and state organizations. These policies and algorithms were followed during the patient's care in the ED.  Final Clinical Impression(s) / ED Diagnoses Final diagnoses:  COVID-19  Community acquired pneumonia of left lower lobe of lung    Rx / DC Orders ED Discharge Orders         Ordered    azithromycin (ZITHROMAX) 250 MG tablet  Daily     12/25/19 0713           Zadie Rhine, MD 12/25/19 [phone]

## 2019-12-25 NOTE — Telephone Encounter (Signed)
Called to discuss with patient about Covid symptoms and the use of bamlanivimab, a monoclonal antibody infusion for those with mild to moderate Covid symptoms and at a high risk of hospitalization.  Pt is qualified for this infusion at the [facility] infusion center due to Age >55 and comorbid conditions   Message left to call back

## 2019-12-30 ENCOUNTER — Inpatient Hospital Stay (HOSPITAL_COMMUNITY)
Admission: EM | Admit: 2019-12-30 | Discharge: 2020-01-05 | DRG: 177 | Disposition: A | Discharge status: Home Health | Payer: No Typology Code available for payment source | Attending: Internal Medicine | Admitting: Internal Medicine

## 2019-12-30 ENCOUNTER — Emergency Department (HOSPITAL_COMMUNITY): Payer: No Typology Code available for payment source

## 2019-12-30 ENCOUNTER — Other Ambulatory Visit: Payer: Self-pay

## 2019-12-30 ENCOUNTER — Inpatient Hospital Stay (HOSPITAL_COMMUNITY): Payer: No Typology Code available for payment source

## 2019-12-30 DIAGNOSIS — E785 Hyperlipidemia, unspecified: Secondary | ICD-10-CM | POA: Diagnosis present

## 2019-12-30 DIAGNOSIS — E669 Obesity, unspecified: Secondary | ICD-10-CM | POA: Diagnosis present

## 2019-12-30 DIAGNOSIS — J9601 Acute respiratory failure with hypoxia: Secondary | ICD-10-CM | POA: Diagnosis present

## 2019-12-30 DIAGNOSIS — J441 Chronic obstructive pulmonary disease with (acute) exacerbation: Secondary | ICD-10-CM | POA: Diagnosis present

## 2019-12-30 DIAGNOSIS — Z803 Family history of malignant neoplasm of breast: Secondary | ICD-10-CM

## 2019-12-30 DIAGNOSIS — U071 COVID-19: Principal | ICD-10-CM

## 2019-12-30 DIAGNOSIS — Z91011 Allergy to milk products: Secondary | ICD-10-CM

## 2019-12-30 DIAGNOSIS — J189 Pneumonia, unspecified organism: Secondary | ICD-10-CM

## 2019-12-30 DIAGNOSIS — Z888 Allergy status to other drugs, medicaments and biological substances status: Secondary | ICD-10-CM | POA: Diagnosis not present

## 2019-12-30 DIAGNOSIS — I10 Essential (primary) hypertension: Secondary | ICD-10-CM | POA: Diagnosis present

## 2019-12-30 DIAGNOSIS — R0602 Shortness of breath: Secondary | ICD-10-CM

## 2019-12-30 DIAGNOSIS — Z87891 Personal history of nicotine dependence: Secondary | ICD-10-CM

## 2019-12-30 DIAGNOSIS — J1282 Pneumonia due to coronavirus disease 2019: Secondary | ICD-10-CM | POA: Diagnosis present

## 2019-12-30 DIAGNOSIS — J188 Other pneumonia, unspecified organism: Secondary | ICD-10-CM | POA: Insufficient documentation

## 2019-12-30 DIAGNOSIS — Z79899 Other long term (current) drug therapy: Secondary | ICD-10-CM | POA: Diagnosis not present

## 2019-12-30 DIAGNOSIS — Z20822 Contact with and (suspected) exposure to covid-19: Secondary | ICD-10-CM | POA: Diagnosis present

## 2019-12-30 DIAGNOSIS — Z6831 Body mass index (BMI) 31.0-31.9, adult: Secondary | ICD-10-CM

## 2019-12-30 DIAGNOSIS — R079 Chest pain, unspecified: Secondary | ICD-10-CM

## 2019-12-30 DIAGNOSIS — F419 Anxiety disorder, unspecified: Secondary | ICD-10-CM | POA: Diagnosis not present

## 2019-12-30 DIAGNOSIS — J440 Chronic obstructive pulmonary disease with acute lower respiratory infection: Secondary | ICD-10-CM | POA: Diagnosis present

## 2019-12-30 DIAGNOSIS — Z8249 Family history of ischemic heart disease and other diseases of the circulatory system: Secondary | ICD-10-CM | POA: Diagnosis not present

## 2019-12-30 LAB — TRIGLYCERIDES: Triglycerides: 108 mg/dL (ref <150)

## 2019-12-30 LAB — FERRITIN: Ferritin: 952 ng/mL — ABNORMAL HIGH (ref 11–307)

## 2019-12-30 LAB — LACTIC ACID, PLASMA: Lactic Acid, Venous: 1 mmol/L (ref 0.5–1.9)

## 2019-12-30 LAB — COMPREHENSIVE METABOLIC PANEL
ALT: 19 U/L (ref 0–44)
AST: 29 U/L (ref 15–41)
Albumin: 3.2 g/dL — ABNORMAL LOW (ref 3.5–5.0)
Alkaline Phosphatase: 50 U/L (ref 38–126)
Anion gap: 14 (ref 5–15)
BUN: 19 mg/dL (ref 6–20)
CO2: 21 mmol/L — ABNORMAL LOW (ref 22–32)
Calcium: 8.1 mg/dL — ABNORMAL LOW (ref 8.9–10.3)
Chloride: 101 mmol/L (ref 98–111)
Creatinine, Ser: 0.84 mg/dL (ref 0.44–1.00)
GFR calc Af Amer: >60 mL/min (ref >60)
GFR calc non Af Amer: >60 mL/min (ref >60)
Glucose, Bld: 97 mg/dL (ref 70–99)
Potassium: 3.8 mmol/L (ref 3.5–5.1)
Sodium: 136 mmol/L (ref 135–145)
Total Bilirubin: 0.8 mg/dL (ref 0.3–1.2)
Total Protein: 7 g/dL (ref 6.5–8.1)

## 2019-12-30 LAB — CBC WITH DIFFERENTIAL/PLATELET
Abs Immature Granulocytes: 0.03 10*3/uL (ref 0.00–0.07)
Basophils Absolute: 0 10*3/uL (ref 0.0–0.1)
Basophils Relative: 0 %
Eosinophils Absolute: 0 10*3/uL (ref 0.0–0.5)
Eosinophils Relative: 0 %
HCT: 35 % — ABNORMAL LOW (ref 36.0–46.0)
Hemoglobin: 11.7 g/dL — ABNORMAL LOW (ref 12.0–15.0)
Immature Granulocytes: 0 %
Lymphocytes Relative: 15 %
Lymphs Abs: 1 10*3/uL (ref 0.7–4.0)
MCH: 30.5 pg (ref 26.0–34.0)
MCHC: 33.4 g/dL (ref 30.0–36.0)
MCV: 91.4 fL (ref 80.0–100.0)
Monocytes Absolute: 0.4 10*3/uL (ref 0.1–1.0)
Monocytes Relative: 6 %
Neutro Abs: 5.7 10*3/uL (ref 1.7–7.7)
Neutrophils Relative %: 79 %
Platelets: 276 10*3/uL (ref 150–400)
RBC: 3.83 MIL/uL — ABNORMAL LOW (ref 3.87–5.11)
RDW: 12.3 % (ref 11.5–15.5)
WBC: 7.2 10*3/uL (ref 4.0–10.5)
nRBC: 0 % (ref 0.0–0.2)

## 2019-12-30 LAB — PROCALCITONIN: Procalcitonin: <0.1 ng/mL

## 2019-12-30 LAB — C-REACTIVE PROTEIN: CRP: 10 mg/dL — ABNORMAL HIGH (ref <1.0)

## 2019-12-30 LAB — HIV ANTIBODY (ROUTINE TESTING W REFLEX): HIV Screen 4th Generation wRfx: NONREACTIVE

## 2019-12-30 LAB — FIBRINOGEN: Fibrinogen: 792 mg/dL — ABNORMAL HIGH (ref 210–475)

## 2019-12-30 LAB — LACTATE DEHYDROGENASE: LDH: 277 U/L — ABNORMAL HIGH (ref 98–192)

## 2019-12-30 LAB — D-DIMER, QUANTITATIVE: D-Dimer, Quant: 0.52 ug/mL-FEU — ABNORMAL HIGH (ref 0.00–0.50)

## 2019-12-30 MED ORDER — ONDANSETRON HCL 4 MG/2ML IJ SOLN: 4.0000 mg | Freq: Four times a day (QID) | INTRAMUSCULAR | Status: DC | PRN

## 2019-12-30 MED ORDER — SODIUM CHLORIDE 0.9 % IV SOLN
200.0000 mg | Freq: Once | INTRAVENOUS | Status: AC
  Administered 2019-12-30: 19:00:00 200 mg via INTRAVENOUS
  Filled 2019-12-30: qty 40

## 2019-12-30 MED ORDER — ACETAMINOPHEN 325 MG PO TABS
650.0000 mg | ORAL_TABLET | Freq: Four times a day (QID) | ORAL | Status: DC | PRN
  Administered 2019-12-30 – 2020-01-03 (×3): 650 mg via ORAL
  Filled 2019-12-30 (×3): qty 2

## 2019-12-30 MED ORDER — ENOXAPARIN SODIUM 40 MG/0.4ML ~~LOC~~ SOLN
40.0000 mg | SUBCUTANEOUS | Status: DC
  Administered 2019-12-30 – 2020-01-04 (×5): 40 mg via SUBCUTANEOUS
  Filled 2019-12-30 (×5): qty 0.4

## 2019-12-30 MED ORDER — BUDESONIDE 0.25 MG/2ML IN SUSP
0.2500 mg | Freq: Two times a day (BID) | RESPIRATORY_TRACT | Status: DC
  Administered 2019-12-31 – 2020-01-01 (×3): 0.25 mg via RESPIRATORY_TRACT
  Filled 2019-12-30 (×5): qty 2

## 2019-12-30 MED ORDER — SODIUM CHLORIDE 0.9 % IV SOLN
100.0000 mg | Freq: Every day | INTRAVENOUS | Status: AC
  Administered 2019-12-31 – 2020-01-03 (×4): 100 mg via INTRAVENOUS
  Filled 2019-12-30 (×7): qty 20

## 2019-12-30 MED ORDER — ALBUTEROL SULFATE HFA 108 (90 BASE) MCG/ACT IN AERS
2.0000 | INHALATION_SPRAY | Freq: Four times a day (QID) | RESPIRATORY_TRACT | Status: DC
  Administered 2019-12-30 – 2020-01-05 (×20): 2 via RESPIRATORY_TRACT
  Filled 2019-12-30 (×4): qty 6.7

## 2019-12-30 MED ORDER — BENZONATATE 100 MG PO CAPS
100.0000 mg | ORAL_CAPSULE | Freq: Three times a day (TID) | ORAL | Status: DC
  Administered 2019-12-30 – 2020-01-01 (×6): 100 mg via ORAL
  Filled 2019-12-30 (×6): qty 1

## 2019-12-30 MED ORDER — GUAIFENESIN-DM 100-10 MG/5ML PO SYRP: 10.0000 mL | ORAL_SOLUTION | ORAL | Status: DC | PRN

## 2019-12-30 MED ORDER — ONDANSETRON HCL 4 MG PO TABS: 4.0000 mg | ORAL_TABLET | Freq: Four times a day (QID) | ORAL | Status: DC | PRN

## 2019-12-30 MED ORDER — DEXAMETHASONE 4 MG PO TABS
6.0000 mg | ORAL_TABLET | ORAL | Status: DC
  Administered 2019-12-30: 6 mg via ORAL
  Filled 2019-12-30: qty 2

## 2019-12-30 NOTE — ED Provider Notes (Signed)
[place] EMERGENCY DEPARTMENT Provider Note   CSN: [id number] Arrival date & time: 12/30/19  1334     History Chief Complaint  Patient presents with  . Pneumonia    [name] is a 54 y.o. female.  Patient with history of asthma bronchitis, former smoker, diagnosed with coronavirus on 4/15 --presents from [facility] where she was found to have room air oxygen saturations in the high 70s.  Patient was seen in the emergency department on 4/21 and was discharged home on azithromycin.  She had a chest x-ray which showed left lower lobe pneumonia.  Patient reports worsening of her symptoms over the past 3 days.  She has persistent cough and low-grade fevers.  She is very short of breath.  EMS was called for transport and administered 600 cc normal saline.  Patient denies significant chest pain or abdominal pain.  No vomiting.  She states that she has not been able to use albuterol at home because of her shortness of breath.          Past Medical History:  Diagnosis Date  . Allergy   . Anemia   . Atypical chest pain 1/11   Pain is not reproducible with exertion.   . Degenerative disk disease    No radiologic evicence in EMR  . Hyperlipidemia   . Tobacco abuse     Patient Active Problem List   Diagnosis Date Noted  . Former smoker 04/06/2017  . Obesity (BMI 30.0-34.9) 04/06/2017  . Essential hypertension 04/06/2017  . Hot flashes due to menopause 04/06/2017  . Acute pain of left shoulder 04/06/2017  . Infertility, female, secondary 10/24/2011  . HYPERLIPIDEMIA 09/07/2009    Past Surgical History:  Procedure Laterality Date  . Other ORIF  2/09    Fracture of the radial head with  intraarticular extension and joint.     OB History   No obstetric history on file.     Family History  Problem Relation Age of Onset  . Breast cancer Mother   . Cancer Mother   . Cancer Father        Unknown type.  Marland Kitchen Heart disease Father     . Cancer Brother        LUNG    Social History   Tobacco Use  . Smoking status: Former Smoker    Quit date: 10/23/2009    Years since quitting: 10.1  . Smokeless tobacco: Never Used  Substance Use Topics  . Alcohol use: Yes    Comment: RARE  . Drug use: No    Home Medications Prior to Admission medications   Medication Sig Start Date End Date Taking? Authorizing Provider  albuterol (PROVENTIL HFA;VENTOLIN HFA) 108 (90 Base) MCG/ACT inhaler Inhale 2 puffs into the lungs every 6 (six) hours as needed for wheezing or shortness of breath. 07/07/17   Tasia Catchings, Amy V, PA-C  amLODipine (NORVASC) 5 MG tablet Take 1 tablet (5 mg total) by mouth daily. 04/06/17   Ladell Pier, MD  azithromycin (ZITHROMAX) 250 MG tablet Take 1 tablet (250 mg total) by mouth daily. 12/25/19   Ripley Fraise, MD  benzonatate (TESSALON) 100 MG capsule Take 1 capsule (100 mg total) by mouth every 8 (eight) hours. 07/07/17   Tasia Catchings, Amy V, PA-C  cetirizine-pseudoephedrine (ZYRTEC-D) 5-120 MG tablet Take 1 tablet by mouth daily. 07/07/17   Tasia Catchings, Amy V, PA-C  diclofenac (VOLTAREN) 75 MG EC tablet Take 1 tablet (75 mg total) by mouth 2 (  two) times daily. 11/09/16   Lenn Sink, DPM  fluticasone (FLONASE) 50 MCG/ACT nasal spray Place 2 sprays into both nostrils daily. 07/07/17   Cathie Hoops, Amy V, PA-C  gabapentin (NEURONTIN) 100 MG capsule Take 2 capsules (200 mg total) by mouth at bedtime. 04/06/17   Marcine Matar, MD  ibuprofen (ADVIL,MOTRIN) 200 MG tablet Take 600-800 mg by mouth daily as needed (pain).    [provider]  loratadine (CLARITIN) 10 MG tablet Take 10 mg by mouth daily.    [provider]  triamcinolone (NASACORT ALLERGY 24HR) 55 MCG/ACT AERO nasal inhaler Place 2 sprays into the nose daily as needed (congestion).    [provider]    Allergies    Lisinopril and Milk-related compounds  Review of Systems   Review of Systems  Constitutional: Positive for fatigue and fever.  HENT:  Negative for rhinorrhea and sore throat.   Eyes: Negative for redness.  Respiratory: Positive for cough and shortness of breath.   Cardiovascular: Negative for chest pain.  Gastrointestinal: Negative for abdominal pain, diarrhea, nausea and vomiting.  Genitourinary: Negative for dysuria.  Musculoskeletal: Negative for myalgias.  Skin: Negative for rash.  Neurological: Negative for headaches.    Physical Exam Updated Vital Signs BP (!) 143/94   Pulse (!) 130   Temp (!) 100.9 F (38.3 C) (Oral)   Resp (!) 32   Ht 5\' 2"  (1.575 m)   Wt 77.6 kg   LMP 06/26/2013   SpO2 96%   BMI 31.28 kg/m   Physical Exam Vitals and nursing note reviewed.  Constitutional:      Appearance: She is well-developed.  HENT:     Head: Normocephalic and atraumatic.  Eyes:     General:        Right eye: No discharge.        Left eye: No discharge.     Conjunctiva/sclera: Conjunctivae normal.  Cardiovascular:     Rate and Rhythm: Regular rhythm. Tachycardia present.     Heart sounds: Normal heart sounds.  Pulmonary:     Effort: Pulmonary effort is normal. Tachypnea present.     Breath sounds: Rhonchi present. No wheezing.  Abdominal:     Palpations: Abdomen is soft.     Tenderness: There is no abdominal tenderness. There is no guarding or rebound.  Musculoskeletal:     Cervical back: Normal range of motion and neck supple.  Skin:    General: Skin is warm and dry.  Neurological:     Mental Status: She is alert.     ED Results / Procedures / Treatments   Labs (all labs ordered are listed, but only abnormal results are displayed) Labs Reviewed  CBC WITH DIFFERENTIAL/PLATELET - Abnormal; Notable for the following components:      Result Value   RBC 3.83 (*)    Hemoglobin 11.7 (*)    HCT 35.0 (*)    All other components within normal limits  COMPREHENSIVE METABOLIC PANEL - Abnormal; Notable for the following components:   CO2 21 (*)    Calcium 8.1 (*)    Albumin 3.2 (*)    All other  components within normal limits  D-DIMER, QUANTITATIVE (NOT AT [facility]) - Abnormal; Notable for the following components:   D-Dimer, Quant 0.52 (*)    All other components within normal limits  LACTATE DEHYDROGENASE - Abnormal; Notable for the following components:   LDH 277 (*)    All other components within normal limits  FERRITIN - Abnormal;  Notable for the following components:   Ferritin 952 (*)    All other components within normal limits  FIBRINOGEN - Abnormal; Notable for the following components:   Fibrinogen 792 (*)    All other components within normal limits  C-REACTIVE PROTEIN - Abnormal; Notable for the following components:   CRP 10.0 (*)    All other components within normal limits  CULTURE, BLOOD (ROUTINE X 2)  CULTURE, BLOOD (ROUTINE X 2)  LACTIC ACID, PLASMA  TRIGLYCERIDES  PROCALCITONIN    EKG EKG Interpretation  Date/Time:  Monday December 30 2019 15:55:45 EDT Ventricular Rate:  103 PR Interval:    QRS Duration: 98 QT Interval:  334 QTC Calculation: 438 R Axis:   15 Text Interpretation: Sinus tachycardia Nonspecific T wave abnormality Confirmed by Cathren Laine ([id number]) on 12/30/2019 4:05:40 PM   Radiology DG Chest Port 1 View  Result Date: 12/30/2019 CLINICAL DATA:  COVID, hypoxia. EXAM: PORTABLE CHEST 1 VIEW COMPARISON:  Prior chest radiographs 12/25/2019 and earlier FINDINGS: Heart size within normal limits. Aortic atherosclerosis. Ill-defined airspace disease within the left mid to lower lung field has somewhat progressed. New from prior exam, there is ill-defined airspace disease within the right lung base. Small amount of fluid now tracking within the minor fissure. No definite pleural effusion. No evidence of pneumothorax. No acute bony abnormality. IMPRESSION: Ill-defined airspace disease within the left mid to lower lung, progressed. New airspace disease within the right lung base. Findings are consistent with multifocal pneumonia. Electronically Signed    By: Jackey Loge DO   On: 12/30/2019 14:37    Procedures Procedures (including critical care time)  Medications Ordered in ED Medications - No data to display  ED Course  I have reviewed the triage vital signs and the nursing notes.  Pertinent labs & imaging results that were available during my care of the patient were reviewed by me and considered in my medical decision making (see chart for details).  Patient seen and examined.  Patient with significant tachypnea, currently on 4 L nasal cannula with saturations in the mid 90s.  She will require admission to the hospital for hypoxic respiratory failure in setting of COVID-19.  Awaiting chest x-ray.   Vital signs reviewed and are as follows: BP (!) 143/94   Pulse (!) 130   Temp (!) 100.9 F (38.3 C) (Oral)   Resp (!) 32   Ht 5\' 2"  (1.575 m)   Wt 77.6 kg   LMP 06/26/2013   SpO2 96%   BMI 31.28 kg/m   4:18 PM I rechecked the patient earlier and updated on her worsening chest x-ray results.  She is aware of need for admission to the hospital.  Her pulse rate is improving and she appears comfortable with oxygen saturation in mid 90s on 4 L nasal cannula.  I have spoken with Dr. 06/28/2013 of [facility] who will see patient.   [name] was evaluated in Emergency Department on 12/30/2019 for the symptoms described in the history of present illness. She was evaluated in the context of the global COVID-19 pandemic, which necessitated consideration that the patient might be at risk for infection with the SARS-CoV-2 virus that causes COVID-19. Institutional protocols and algorithms that pertain to the evaluation of patients at risk for COVID-19 are in a state of rapid change based on information released by regulatory bodies including the CDC and federal and state organizations. These policies and algorithms were followed during the patient's care in the ED.  CRITICAL CARE Performed by: Renne Crigler PA-C Total critical care  time: 35 minutes Critical care time was exclusive of separately billable procedures and treating other patients. Critical care was necessary to treat or prevent imminent or life-threatening deterioration. Critical care was time spent personally by me on the following activities: development of treatment plan with patient and/or surrogate as well as nursing, discussions with consultants, evaluation of patient's response to treatment, examination of patient, obtaining history from patient or surrogate, ordering and performing treatments and interventions, ordering and review of laboratory studies, ordering and review of radiographic studies, pulse oximetry and re-evaluation of patient's condition.     MDM Rules/Calculators/A&P                      Patient with known COVID-19 now with worsening multifocal pneumonia and hypoxic respiratory failure requiring admission to the hospital.   Final Clinical Impression(s) / ED Diagnoses Final diagnoses:  Multifocal pneumonia  Acute respiratory failure with hypoxia (HCC)  COVID-19 virus infection    Rx / DC Orders ED Discharge Orders    None       Renne Crigler, Cordelia Poche 12/30/19 1620    Tilden Fossa, MD 12/31/19 1742

## 2019-12-30 NOTE — ED Triage Notes (Signed)
Pt here via EMS from [facility], dx with covid on 4/15, worsening respiratory symptoms in the las three days. Room air O2 sats in high 70s at office, with improvement to 97% on 4L. Tachypnic, coughing, in distress. 600 NS given by EMS.

## 2019-12-30 NOTE — H&P (Signed)
History and Physical    [name] VOH:[id number] DOB: 05-Feb-1966 DOA: 12/30/2019  PCP: Ladell Pier, MD   Patient coming from: Home  I have personally briefly reviewed patient's old medical records in [place]  Chief Complaint: SOB  HPI: [name] is a 54 y.o. female with medical history significant of asthma bronchitis, former smoker, diagnosed with coronavirus 10 days ago who came to emergency department on 4/21 for fatigue, dry cough and short of breath.    At that time, chest x-ray showed left lower lobe pneumonia, but no significant hypoxia, patient was discharged home with Zithromax.  Patient reports worsening of her symptoms over the past 3 days.  She has persistent cough and low-grade fever, and increasing short of breath.   Today, she went to [facility] where she was found to have room air oxygen saturations in the high 70s.  ED Course: CT showing Low lung volumes with bibasilar dependent atelectasis/partial collapse. Heterogeneous bilateral peripheral opacities in the mid lower lung zone predominant distribution, consistent with COVID-19 pneumonia.  No leukocytosis no left shift, procalcitonin level low.  Review of Systems: As per HPI otherwise 10 point review of systems negative.    Past Medical History:  Diagnosis Date  . Allergy   . Anemia   . Atypical chest pain 1/11   Pain is not reproducible with exertion.   . Degenerative disk disease    No radiologic evicence in EMR  . Hyperlipidemia   . Tobacco abuse     Past Surgical History:  Procedure Laterality Date  . Other ORIF  2/09    Fracture of the radial head with  intraarticular extension and joint.     reports that she quit smoking about 10 years ago. She has never used smokeless tobacco. She reports current alcohol use. She reports that she does not use drugs.  Allergies  Allergen Reactions  . Lisinopril     Headache, upset stomach and rectal bleeding    . Milk-Related Compounds Other (See Comments)    Gi upset    Family History  Problem Relation Age of Onset  . Breast cancer Mother   . Cancer Mother   . Cancer Father        Unknown type.  Marland Kitchen Heart disease Father   . Cancer Brother        LUNG     Prior to Admission medications   Medication Sig Start Date End Date Taking? Authorizing Provider  albuterol (PROVENTIL HFA;VENTOLIN HFA) 108 (90 Base) MCG/ACT inhaler Inhale 2 puffs into the lungs every 6 (six) hours as needed for wheezing or shortness of breath. Patient not taking: Reported on 12/30/2019 07/07/17   Ok Edwards, PA-C  amLODipine (NORVASC) 5 MG tablet Take 1 tablet (5 mg total) by mouth daily. Patient not taking: Reported on 12/30/2019 04/06/17   Ladell Pier, MD  azithromycin (ZITHROMAX) 250 MG tablet Take 1 tablet (250 mg total) by mouth daily. Patient not taking: Reported on 12/30/2019 12/25/19   Ripley Fraise, MD  benzonatate (TESSALON) 100 MG capsule Take 1 capsule (100 mg total) by mouth every 8 (eight) hours. Patient not taking: Reported on 12/30/2019 07/07/17   Ok Edwards, PA-C  cetirizine-pseudoephedrine (ZYRTEC-D) 5-120 MG tablet Take 1 tablet by mouth daily. Patient not taking: Reported on 12/30/2019 07/07/17   Ok Edwards, PA-C  diclofenac (VOLTAREN) 75 MG EC tablet Take 1 tablet (75 mg total) by mouth 2 (two) times daily. Patient not  taking: Reported on 12/30/2019 11/09/16   Lenn Sink, DPM  fluticasone [facility]) 50 MCG/ACT nasal spray Place 2 sprays into both nostrils daily. Patient not taking: Reported on 12/30/2019 07/07/17   Belinda Fisher, PA-C  gabapentin (NEURONTIN) 100 MG capsule Take 2 capsules (200 mg total) by mouth at bedtime. Patient not taking: Reported on 12/30/2019 04/06/17   Marcine Matar, MD    Physical Exam: Vitals:   12/30/19 1800 12/30/19 1815 12/30/19 1844 12/30/19 1845  BP: 120/80 128/81 133/88 135/90  Pulse: (!) 105 (!) 110 (!) 114 (!) 111  Resp: (!) 27 (!) 26 (!) 28 (!) 32  Temp:   (!)  100.5 F (38.1 C)   TempSrc:   Oral   SpO2: 97% 97% 93% 93%  Weight:      Height:        Constitutional: NAD, calm, comfortable Vitals:   12/30/19 1800 12/30/19 1815 12/30/19 1844 12/30/19 1845  BP: 120/80 128/81 133/88 135/90  Pulse: (!) 105 (!) 110 (!) 114 (!) 111  Resp: (!) 27 (!) 26 (!) 28 (!) 32  Temp:   (!) 100.5 F (38.1 C)   TempSrc:   Oral   SpO2: 97% 97% 93% 93%  Weight:      Height:       Eyes: PERRL, lids and conjunctivae normal ENMT: Mucous membranes are moist. Posterior pharynx clear of any exudate or lesions.Normal dentition.  Neck: normal, supple, no masses, no thyromegaly Respiratory: Some scattered crackles on bilateral lower bases, significant decrease in breathing sound with scattered wheezing.  Increased respiratory effort, with positive signs of using accessory muscle use.  Cardiovascular: Regular rate and rhythm, no murmurs / rubs / gallops. No extremity edema. 2+ pedal pulses. No carotid bruits.  Abdomen: no tenderness, no masses palpated. No hepatosplenomegaly. Bowel sounds positive.  Musculoskeletal: no clubbing / cyanosis. No joint deformity upper and lower extremities. Good ROM, no contractures. Normal muscle tone.  Skin: no rashes, lesions, ulcers. No induration Neurologic: CN 2-12 grossly intact. Sensation intact, DTR normal. Strength 5/5 in all 4.  Psychiatric: Normal judgment and insight. Alert and oriented x 3. Normal mood.     Labs on Admission: I have personally reviewed following labs and imaging studies  CBC: Recent Labs  Lab 12/25/19 0346 12/30/19 1447  WBC 5.9 7.2  NEUTROABS 3.4 5.7  HGB 12.6 11.7*  HCT 37.9 35.0*  MCV 90.9 91.4  PLT 303 276   Basic Metabolic Panel: Recent Labs  Lab 12/25/19 0346 12/30/19 1447  NA 134* 136  K 3.9 3.8  CL 96* 101  CO2 24 21*  GLUCOSE 113* 97  BUN 17 19  CREATININE 0.75 0.84  CALCIUM 8.7* 8.1*   GFR: Estimated Creatinine Clearance: 72.1 mL/min (by C-G formula based on SCr of 0.84  mg/dL). Liver Function Tests: Recent Labs  Lab 12/25/19 0346 12/30/19 1447  AST 26 29  ALT 32 19  ALKPHOS 70 50  BILITOT 0.4 0.8  PROT 7.2 7.0  ALBUMIN 3.6 3.2*   No results for input(s): LIPASE, AMYLASE in the last 168 hours. No results for input(s): AMMONIA in the last 168 hours. Coagulation Profile: No results for input(s): INR, PROTIME in the last 168 hours. Cardiac Enzymes: No results for input(s): CKTOTAL, CKMB, CKMBINDEX, TROPONINI in the last 168 hours. BNP (last 3 results) No results for input(s): PROBNP in the last 8760 hours. HbA1C: No results for input(s): HGBA1C in the last 72 hours. CBG: No results for input(s): GLUCAP in the  last 168 hours. Lipid Profile: Recent Labs    12/30/19 1447  TRIG 108   Thyroid Function Tests: No results for input(s): TSH, T4TOTAL, FREET4, T3FREE, THYROIDAB in the last 72 hours. Anemia Panel: Recent Labs    12/30/19 1447  FERRITIN 952*   Urine analysis:    Component Value Date/Time   COLORURINE YELLOW 12/25/2019 0559   APPEARANCEUR HAZY (A) 12/25/2019 0559   LABSPEC 1.025 12/25/2019 0559   PHURINE 5.0 12/25/2019 0559   GLUCOSEU NEGATIVE 12/25/2019 0559   HGBUR NEGATIVE 12/25/2019 0559   BILIRUBINUR NEGATIVE 12/25/2019 0559   KETONESUR NEGATIVE 12/25/2019 0559   PROTEINUR 30 (A) 12/25/2019 0559   UROBILINOGEN 0.2 10/24/2011 1509   NITRITE NEGATIVE 12/25/2019 0559   LEUKOCYTESUR NEGATIVE 12/25/2019 0559    Radiological Exams on Admission: DG Chest Port 1 View  Result Date: 12/30/2019 CLINICAL DATA:  COVID, hypoxia. EXAM: PORTABLE CHEST 1 VIEW COMPARISON:  Prior chest radiographs 12/25/2019 and earlier FINDINGS: Heart size within normal limits. Aortic atherosclerosis. Ill-defined airspace disease within the left mid to lower lung field has somewhat progressed. New from prior exam, there is ill-defined airspace disease within the right lung base. Small amount of fluid now tracking within the minor fissure. No definite  pleural effusion. No evidence of pneumothorax. No acute bony abnormality. IMPRESSION: Ill-defined airspace disease within the left mid to lower lung, progressed. New airspace disease within the right lung base. Findings are consistent with multifocal pneumonia. Electronically Signed   By: Jackey Loge DO   On: 12/30/2019 14:37    EKG: Independently reviewed.  Sinus tachycardia  Assessment/Plan Active Problems:   COVID-19 virus infection   COVID-19  Acute hypoxic restaurant failure secondary to COVID-19 pneumonia plus acute asthma/COPD exacerbation Treat COVID-19 with remdesivir and steroid Breathing meds with albuterol around clock and as needed Supportive care of frequent proning, Tessalon and Tussin No significant leukocytosis, CT chest showed no focal consolidation, will hold off antibiotics  Acute asthma/COPD exacerbation P.o. steroid, SABA and Pulmicort Outpatient lung function test   DVT prophylaxis: Lovenox Code Status: Full code Family Communication: None at bedside Disposition Plan: Once oxygenation improved, patient can be discharged home Consults called: None Admission status: Telemetry admission  Emeline General MD [facility] Pager [phone]    12/30/2019, 6:55 PM

## 2019-12-31 LAB — BLOOD CULTURE ID PANEL (REFLEXED)

## 2019-12-31 LAB — CBC WITH DIFFERENTIAL/PLATELET
Abs Immature Granulocytes: 0.01 10*3/uL (ref 0.00–0.07)
Basophils Absolute: 0 10*3/uL (ref 0.0–0.1)
Basophils Relative: 0 %
Eosinophils Absolute: 0 10*3/uL (ref 0.0–0.5)
Eosinophils Relative: 0 %
HCT: 34.2 % — ABNORMAL LOW (ref 36.0–46.0)
Hemoglobin: 11.3 g/dL — ABNORMAL LOW (ref 12.0–15.0)
Immature Granulocytes: 0 %
Lymphocytes Relative: 15 %
Lymphs Abs: 0.6 10*3/uL — ABNORMAL LOW (ref 0.7–4.0)
MCH: 30.5 pg (ref 26.0–34.0)
MCHC: 33 g/dL (ref 30.0–36.0)
MCV: 92.2 fL (ref 80.0–100.0)
Monocytes Absolute: 0.1 10*3/uL (ref 0.1–1.0)
Monocytes Relative: 3 %
Neutro Abs: 3.4 10*3/uL (ref 1.7–7.7)
Neutrophils Relative %: 82 %
Platelets: 284 10*3/uL (ref 150–400)
RBC: 3.71 MIL/uL — ABNORMAL LOW (ref 3.87–5.11)
RDW: 12.5 % (ref 11.5–15.5)
WBC: 4.1 10*3/uL (ref 4.0–10.5)
nRBC: 0 % (ref 0.0–0.2)

## 2019-12-31 LAB — MAGNESIUM: Magnesium: 2.4 mg/dL (ref 1.7–2.4)

## 2019-12-31 LAB — COMPREHENSIVE METABOLIC PANEL
ALT: 20 U/L (ref 0–44)
AST: 28 U/L (ref 15–41)
Albumin: 2.9 g/dL — ABNORMAL LOW (ref 3.5–5.0)
Alkaline Phosphatase: 46 U/L (ref 38–126)
Anion gap: 13 (ref 5–15)
BUN: 20 mg/dL (ref 6–20)
CO2: 21 mmol/L — ABNORMAL LOW (ref 22–32)
Calcium: 8.2 mg/dL — ABNORMAL LOW (ref 8.9–10.3)
Chloride: 104 mmol/L (ref 98–111)
Creatinine, Ser: 0.63 mg/dL (ref 0.44–1.00)
GFR calc Af Amer: >60 mL/min (ref >60)
GFR calc non Af Amer: >60 mL/min (ref >60)
Glucose, Bld: 139 mg/dL — ABNORMAL HIGH (ref 70–99)
Potassium: 3.8 mmol/L (ref 3.5–5.1)
Sodium: 138 mmol/L (ref 135–145)
Total Bilirubin: 0.8 mg/dL (ref 0.3–1.2)
Total Protein: 6.7 g/dL (ref 6.5–8.1)

## 2019-12-31 LAB — FERRITIN: Ferritin: 891 ng/mL — ABNORMAL HIGH (ref 11–307)

## 2019-12-31 LAB — C-REACTIVE PROTEIN: CRP: 10.4 mg/dL — ABNORMAL HIGH (ref <1.0)

## 2019-12-31 LAB — PHOSPHORUS: Phosphorus: 4.3 mg/dL (ref 2.5–4.6)

## 2019-12-31 LAB — D-DIMER, QUANTITATIVE: D-Dimer, Quant: 0.52 ug/mL-FEU — ABNORMAL HIGH (ref 0.00–0.50)

## 2019-12-31 MED ORDER — DEXAMETHASONE SODIUM PHOSPHATE 10 MG/ML IJ SOLN
10.0000 mg | Freq: Every day | INTRAMUSCULAR | Status: DC
  Administered 2019-12-31 – 2020-01-04 (×5): 10 mg via INTRAVENOUS
  Filled 2019-12-31 (×5): qty 1

## 2019-12-31 NOTE — Progress Notes (Signed)
PROGRESS NOTE                                                                                                                                                                                                             Patient Demographics:    [name], is a 54 y.o. female, DOB - 1965-11-10, YWV:[id number]  Admit date - 12/30/2019   Admitting Physician Emeline General, MD  Outpatient Primary MD for the patient is Marcine Matar, MD  LOS - 1   Chief Complaint  Patient presents with  . Pneumonia       Brief Narrative    54 y.o. female with medical history significant of asthma bronchitis, former smoker, diagnosed with coronavirus 10 days ago who came to emergency department on 4/21 for fatigue, dry cough and short of breath.  At that time, chest x-ray showed left lower lobe pneumonia, but no significant hypoxia, patient was discharged home with Zithromax. Patient reports worsening symptoms, she was found to have room air oxygen saturations in the high 70s. CT showing Low lung volumes with bibasilar dependent atelectasis/partial collapse.  No leukocytosis no left shift, procalcitonin level low.   Subjective:    [name] today she reports dyspnea, cough , denies any chest pain.    Assessment  & Plan :    Active Problems:   COVID-19 virus infection   COVID-19   Acute hypoxic respiratory failure due to COVID-19 pneumonia. -Patient on 4 L nasal cannula, she is with evidence of multifocal opacity on imaging. -Continue with steroids, will change milligrams of oral Decadron to IV 10 mg Decadron daily. -Continue with remdesivir. -He was encouraged use incentive spirometry, flutter valve, to do modified proning, and out of bed to chair. -Use Actemra if it is felt indicated, at this point I will hold on Actemra given she is on 4 L, have discussed Actemra with her, she has consented for Actemra if it is indicated, she remains on 4 L nasal  cannula, I have discussed with her if she has increased oxygen requirement then will proceed with Actemra, she denies any history of TB, hepatitis viral infection, perforation or diverticulitis, no malignancy or immunotherapy . -Procalcitonin within normal limit, no indications for antibiotics.  The treatment plan and use of medications and known side effects were discussed with patient/family, they were clearly  explained that there is no proven definitive treatment for COVID-19 infection, any medications used here are based on published clinical articles/anecdotal data which are not peer-reviewed or randomized control trials.  Complete risks and long-term side effects are unknown, however in the best clinical judgment they seem to be of some clinical benefit rather than medical risks.  Patient/family agree with the treatment plan and want to receive the given medications.  Hypertension -Blood pressure is acceptable, continue to hold Norvasc.  1/4 blood cultures positive for GPC coagulase-negative staph -Is most likely contaminant, no indication to treat, will recheck blood cultures, will monitor  off antibiotics especially with normal procalcitonin.  Recent Labs  Lab 12/25/19 0346 12/30/19 1447 12/31/19 0511  WBC 5.9 7.2 4.1  HGB 12.6 11.7* 11.3*  HCT 37.9 35.0* 34.2*  PLT 303 276 284  MCV 90.9 91.4 92.2  MCH 30.2 30.5 30.5  MCHC 33.2 33.4 33.0  RDW 12.5 12.3 12.5  LYMPHSABS 1.6 1.0 0.6*  MONOABS 0.8 0.4 0.1  EOSABS 0.1 0.0 0.0  BASOSABS 0.0 0.0 0.0    Recent Labs  Lab 12/25/19 0346 12/30/19 1447 12/31/19 0511  NA 134* 136 138  K 3.9 3.8 3.8  CL 96* 101 104  CO2 24 21* 21*  GLUCOSE 113* 97 139*  BUN 17 19 20   CREATININE 0.75 0.84 0.63  CALCIUM 8.7* 8.1* 8.2*  AST 26 29 28   ALT 32 19 20  ALKPHOS 70 50 46  BILITOT 0.4 0.8 0.8  ALBUMIN 3.6 3.2* 2.9*  MG  --   --  2.4  CRP  --  10.0* 10.4*  DDIMER  --  0.52* 0.52*  PROCALCITON  --  <0.10  --     Recent Labs  Lab  12/30/19 1447 12/31/19 0511  CRP 10.0* 10.4*  DDIMER 0.52* 0.52*  PROCALCITON <0.10  --         COVID-19 Labs  Recent Labs    12/30/19 1447 12/31/19 0511  DDIMER 0.52* 0.52*  FERRITIN 952* 891*  LDH 277*  --   CRP 10.0* 10.4*    No results found for: SARSCOV2NAA   Code Status :   Family Communication  :   Disposition Plan  :  Status is: Inpatient  Remains inpatient appropriate because:IV treatments appropriate due to intensity of illness or inability to take PO   Dispo: The patient is from: Home              Anticipated d/c is to: Home              Anticipated d/c date is: > 3 days              Patient currently is not medically stable to d/c.   Consults  :  None  Procedures  : None  DVT Prophylaxis  :  [place] lovenox  Lab Results  Component Value Date   PLT 284 12/31/2019    Antibiotics  :   Anti-infectives (From admission, onward)   Start     Dose/Rate Route Frequency Ordered Stop   12/31/19 1000  remdesivir 100 mg in sodium chloride 0.9 % 100 mL IVPB     100 mg 200 mL/hr over 30 Minutes Intravenous Daily 12/30/19 1713 01/04/20 0959   12/30/19 1800  remdesivir 200 mg in sodium chloride 0.9% 250 mL IVPB     200 mg 580 mL/hr over 30 Minutes Intravenous Once 12/30/19 1713 12/30/19 2029        Objective:   Vitals:  12/31/19 1245 12/31/19 1300 12/31/19 1315 12/31/19 1330  BP: 112/77 104/70 118/74 117/72  Pulse: 85 84 84 85  Resp: (!) 21 (!) 25 (!) 23 (!) 23  Temp:      TempSrc:      SpO2: 90% 90% 91% 91%  Weight:      Height:        Wt Readings from Last 3 Encounters:  12/30/19 77.6 kg  12/25/19 77.6 kg  04/06/17 86.6 kg     Intake/Output Summary (Last 24 hours) at 12/31/2019 1444 Last data filed at 12/30/2019 2029 Gross per 24 hour  Intake 250 ml  Output --  Net 250 ml     Physical Exam  Awake Alert, Oriented X 3, No new F.N deficits, Normal affect Symmetrical Chest wall movement, Good air movement bilaterally, CTAB RRR,No  Gallops,Rubs or new Murmurs, No Parasternal Heave +ve B.Sounds, Abd Soft, No tenderness,  No rebound - guarding or rigidity. No Cyanosis, Clubbing or edema, No new Rash or bruise      Data Review:    CBC Recent Labs  Lab 12/25/19 0346 12/30/19 1447 12/31/19 0511  WBC 5.9 7.2 4.1  HGB 12.6 11.7* 11.3*  HCT 37.9 35.0* 34.2*  PLT 303 276 284  MCV 90.9 91.4 92.2  MCH 30.2 30.5 30.5  MCHC 33.2 33.4 33.0  RDW 12.5 12.3 12.5  LYMPHSABS 1.6 1.0 0.6*  MONOABS 0.8 0.4 0.1  EOSABS 0.1 0.0 0.0  BASOSABS 0.0 0.0 0.0    Chemistries  Recent Labs  Lab 12/25/19 0346 12/30/19 1447 12/31/19 0511  NA 134* 136 138  K 3.9 3.8 3.8  CL 96* 101 104  CO2 24 21* 21*  GLUCOSE 113* 97 139*  BUN 17 19 20   CREATININE 0.75 0.84 0.63  CALCIUM 8.7* 8.1* 8.2*  MG  --   --  2.4  AST 26 29 28   ALT 32 19 20  ALKPHOS 70 50 46  BILITOT 0.4 0.8 0.8   ------------------------------------------------------------------------------------------------------------------ Recent Labs    12/30/19 1447  TRIG 108    Lab Results  Component Value Date   HGBA1C  08/20/2009    5.5 (NOTE) The ADA recommends the following therapeutic goal for glycemic control related to Hgb A1c measurement: Goal of therapy: <6.5 Hgb A1c  Reference: American Diabetes Association: Clinical Practice Recommendations 2010, Diabetes Care, 2010, 33: (Suppl  1).   ------------------------------------------------------------------------------------------------------------------ No results for input(s): TSH, T4TOTAL, T3FREE, THYROIDAB in the last 72 hours.  Invalid input(s): FREET3 ------------------------------------------------------------------------------------------------------------------ Recent Labs    12/30/19 1447 12/31/19 0511  FERRITIN 952* 891*    Coagulation profile No results for input(s): INR, PROTIME in the last 168 hours.  Recent Labs    12/30/19 1447 12/31/19 0511  DDIMER 0.52* 0.52*    Cardiac  Enzymes No results for input(s): CKMB, TROPONINI, MYOGLOBIN in the last 168 hours.  Invalid input(s): CK ------------------------------------------------------------------------------------------------------------------ No results found for: BNP  Inpatient Medications  Scheduled Meds: . albuterol  2 puff Inhalation Q6H  . benzonatate  100 mg Oral Q8H  . budesonide (PULMICORT) nebulizer solution  0.25 mg Nebulization BID  . dexamethasone  6 mg Oral Q24H  . enoxaparin (LOVENOX) injection  40 mg Subcutaneous Q24H   Continuous Infusions: . remdesivir 100 mg in NS 100 mL 100 mg (12/31/19 1337)   PRN Meds:.acetaminophen, guaiFENesin-dextromethorphan, ondansetron **OR** ondansetron (ZOFRAN) IV  Micro Results Recent Results (from the past 240 hour(s))  Blood Culture (routine x 2)     Status: None (Preliminary result)  Collection Time: 12/30/19  2:47 PM   Specimen: BLOOD  Result Value Ref Range Status   Specimen Description BLOOD RIGHT ANTECUBITAL  Final   Special Requests   Final    BOTTLES DRAWN AEROBIC AND ANAEROBIC Blood Culture adequate volume   Culture   Final    NO GROWTH < 24 HOURS Performed at [facility], [address] [place], [place] [address]    Report Status PENDING  Incomplete  Blood Culture (routine x 2)     Status: None (Preliminary result)   Collection Time: 12/30/19  2:47 PM   Specimen: BLOOD  Result Value Ref Range Status   Specimen Description BLOOD LEFT ANTECUBITAL  Final   Special Requests   Final    BOTTLES DRAWN AEROBIC AND ANAEROBIC Blood Culture adequate volume   Culture  Setup Time   Final    GRAM POSITIVE COCCI IN CLUSTERS AEROBIC BOTTLE ONLY Organism ID to follow CRITICAL RESULT CALLED TO, READ BACK BY AND VERIFIED WITH: Danella MaiersK. Naik PharmD 13:20 12/31/19 ([email or web address]) Performed at [facility], [address] [place], [place] [address]    Culture GRAM POSITIVE COCCI IN CLUSTERS  Final   Report Status PENDING  Incomplete  Blood Culture ID  Panel (Reflexed)     Status: Abnormal   Collection Time: 12/30/19  2:47 PM  Result Value Ref Range Status   Enterococcus species NOT DETECTED NOT DETECTED Final   Listeria monocytogenes NOT DETECTED NOT DETECTED Final   Staphylococcus species DETECTED (A) NOT DETECTED Final    Comment: Methicillin (oxacillin) susceptible coagulase negative staphylococcus. Possible blood culture contaminant (unless isolated from more than one blood culture draw or clinical case suggests pathogenicity). No antibiotic treatment is indicated for blood  culture contaminants. CRITICAL RESULT CALLED TO, READ BACK BY AND VERIFIED WITH: Danella MaiersK. Naik PharmD 13:20 12/31/19 ([email or web address])    Staphylococcus aureus (BCID) NOT DETECTED NOT DETECTED Final   Methicillin resistance NOT DETECTED NOT DETECTED Final   Streptococcus species NOT DETECTED NOT DETECTED Final   Streptococcus agalactiae NOT DETECTED NOT DETECTED Final   Streptococcus pneumoniae NOT DETECTED NOT DETECTED Final   Streptococcus pyogenes NOT DETECTED NOT DETECTED Final   Acinetobacter baumannii NOT DETECTED NOT DETECTED Final   Enterobacteriaceae species NOT DETECTED NOT DETECTED Final   Enterobacter cloacae complex NOT DETECTED NOT DETECTED Final   Escherichia coli NOT DETECTED NOT DETECTED Final   Klebsiella oxytoca NOT DETECTED NOT DETECTED Final   Klebsiella pneumoniae NOT DETECTED NOT DETECTED Final   Proteus species NOT DETECTED NOT DETECTED Final   Serratia marcescens NOT DETECTED NOT DETECTED Final   Haemophilus influenzae NOT DETECTED NOT DETECTED Final   Neisseria meningitidis NOT DETECTED NOT DETECTED Final   Pseudomonas aeruginosa NOT DETECTED NOT DETECTED Final   Candida albicans NOT DETECTED NOT DETECTED Final   Candida glabrata NOT DETECTED NOT DETECTED Final   Candida krusei NOT DETECTED NOT DETECTED Final   Candida parapsilosis NOT DETECTED NOT DETECTED Final   Candida tropicalis NOT DETECTED NOT DETECTED Final    Comment: Performed at [facility], [address] [place], [place] [address]    Radiology Reports CT CHEST WO CONTRAST  Result Date: 12/30/2019 CLINICAL DATA:  Unresolved pneumonia. Diagnosed with COVID 415. Worsening respiratory symptoms. Hypoxia. EXAM: CT CHEST WITHOUT CONTRAST TECHNIQUE: Multidetector CT imaging of the chest was performed following the standard protocol without IV contrast. COMPARISON:  Radiograph earlier this day, also 12/25/2019. FINDINGS: Cardiovascular: Thoracic aorta is normal in caliber.  Minor aortic atherosclerosis. Heart is normal in size. No pericardial effusion. Mediastinum/Nodes: Limited assessment for adenopathy given low lung volumes, patient motion, and lack of contrast. Scattered prominent mediastinal lymph nodes. Cannot assess for hilar adenopathy given lack of IV contrast. Distal esophagus slightly patulous. No visualized thyroid nodule. Lungs/Pleura: Low lung volumes with bibasilar dependent atelectasis/collapse. Heterogeneous bilateral peripheral opacities in the mid lower lung zone predominant distribution. No pleural fluid. No evidence of pulmonary edema. Trachea and mainstem bronchi are patent. Upper Abdomen: Mild bilateral perinephric edema is nonspecific. Musculoskeletal: There are no acute or suspicious osseous abnormalities. IMPRESSION: Low lung volumes with bibasilar dependent atelectasis/partial collapse. Heterogeneous bilateral peripheral opacities in the mid lower lung zone predominant distribution, consistent with COVID-19 pneumonia. Aortic Atherosclerosis (ICD10-I70.0). Electronically Signed   By: Keith Rake M.D.   On: 12/30/2019 19:01   DG Chest Port 1 View  Result Date: 12/30/2019 CLINICAL DATA:  COVID, hypoxia. EXAM: PORTABLE CHEST 1 VIEW COMPARISON:  Prior chest radiographs 12/25/2019 and earlier FINDINGS: Heart size within normal limits. Aortic atherosclerosis. Ill-defined airspace disease within the left mid to lower lung field has somewhat progressed. New from  prior exam, there is ill-defined airspace disease within the right lung base. Small amount of fluid now tracking within the minor fissure. No definite pleural effusion. No evidence of pneumothorax. No acute bony abnormality. IMPRESSION: Ill-defined airspace disease within the left mid to lower lung, progressed. New airspace disease within the right lung base. Findings are consistent with multifocal pneumonia. Electronically Signed   By: Kellie Simmering DO   On: 12/30/2019 14:37   DG Chest Port 1 View  Result Date: 12/25/2019 CLINICAL DATA:  Cough. COVID positive. EXAM: PORTABLE CHEST 1 VIEW COMPARISON:  Two-view chest x-ray 03/05/2017 FINDINGS: The heart size is normal. New left lower lobe airspace disease is present. Upper lobes are clear bilaterally. Right lung is clear. IMPRESSION: New left lower lobe airspace disease compatible with pneumonia. Electronically Signed   By: San Morelle M.D.   On: 12/25/2019 06:32     Phillips Climes M.D on 12/31/2019 at 2:44 PM  Between 7am to 7pm  After 7pm go to [email or web address] - password [facility]  [facility] -  Office  [phone]

## 2019-12-31 NOTE — ED Notes (Signed)
Lunch Tray Ordered @1144. 

## 2019-12-31 NOTE — Progress Notes (Signed)
PHARMACY - PHYSICIAN COMMUNICATION CRITICAL VALUE ALERT - BLOOD CULTURE IDENTIFICATION (BCID)  Kassadi Brieanne Mignone is an 54 y.o. female who presented to [facility] on 12/30/2019 with a chief complaint of shortness of breath  Assessment:  1/4 GPC coagulase negative staphylococcus mec A negative  Name of physician (or Provider) Contacted: Dr. Randol Kern  Current antibiotics: None  Changes to prescribed antibiotics recommended: None  No results found for this or any previous visit.  Alvia Grove, PharmD PGY1 Acute Care Pharmacy Resident 12/31/2019  1:20 PM

## 2020-01-01 ENCOUNTER — Inpatient Hospital Stay (HOSPITAL_COMMUNITY): Payer: No Typology Code available for payment source

## 2020-01-01 DIAGNOSIS — I10 Essential (primary) hypertension: Secondary | ICD-10-CM

## 2020-01-01 DIAGNOSIS — J1282 Pneumonia due to coronavirus disease 2019: Secondary | ICD-10-CM

## 2020-01-01 DIAGNOSIS — U071 COVID-19: Principal | ICD-10-CM

## 2020-01-01 DIAGNOSIS — J9601 Acute respiratory failure with hypoxia: Secondary | ICD-10-CM

## 2020-01-01 LAB — COMPREHENSIVE METABOLIC PANEL
ALT: 20 U/L (ref 0–44)
AST: 24 U/L (ref 15–41)
Albumin: 2.9 g/dL — ABNORMAL LOW (ref 3.5–5.0)
Alkaline Phosphatase: 42 U/L (ref 38–126)
Anion gap: 18 — ABNORMAL HIGH (ref 5–15)
BUN: 30 mg/dL — ABNORMAL HIGH (ref 6–20)
CO2: 13 mmol/L — ABNORMAL LOW (ref 22–32)
Calcium: 8.4 mg/dL — ABNORMAL LOW (ref 8.9–10.3)
Chloride: 109 mmol/L (ref 98–111)
Creatinine, Ser: 0.65 mg/dL (ref 0.44–1.00)
GFR calc Af Amer: >60 mL/min (ref >60)
GFR calc non Af Amer: >60 mL/min (ref >60)
Glucose, Bld: 156 mg/dL — ABNORMAL HIGH (ref 70–99)
Potassium: 3.7 mmol/L (ref 3.5–5.1)
Sodium: 140 mmol/L (ref 135–145)
Total Bilirubin: 0.7 mg/dL (ref 0.3–1.2)
Total Protein: 6.6 g/dL (ref 6.5–8.1)

## 2020-01-01 LAB — CBC WITH DIFFERENTIAL/PLATELET
Abs Immature Granulocytes: 0.04 10*3/uL (ref 0.00–0.07)
Basophils Absolute: 0 10*3/uL (ref 0.0–0.1)
Basophils Relative: 0 %
Eosinophils Absolute: 0 10*3/uL (ref 0.0–0.5)
Eosinophils Relative: 0 %
HCT: 32.2 % — ABNORMAL LOW (ref 36.0–46.0)
Hemoglobin: 11 g/dL — ABNORMAL LOW (ref 12.0–15.0)
Immature Granulocytes: 1 %
Lymphocytes Relative: 12 %
Lymphs Abs: 0.8 10*3/uL (ref 0.7–4.0)
MCH: 31 pg (ref 26.0–34.0)
MCHC: 34.2 g/dL (ref 30.0–36.0)
MCV: 90.7 fL (ref 80.0–100.0)
Monocytes Absolute: 0.4 10*3/uL (ref 0.1–1.0)
Monocytes Relative: 5 %
Neutro Abs: 5.5 10*3/uL (ref 1.7–7.7)
Neutrophils Relative %: 82 %
Platelets: 336 10*3/uL (ref 150–400)
RBC: 3.55 MIL/uL — ABNORMAL LOW (ref 3.87–5.11)
RDW: 12.4 % (ref 11.5–15.5)
WBC: 6.7 10*3/uL (ref 4.0–10.5)
nRBC: 0 % (ref 0.0–0.2)

## 2020-01-01 LAB — C-REACTIVE PROTEIN: CRP: 6.3 mg/dL — ABNORMAL HIGH (ref <1.0)

## 2020-01-01 LAB — D-DIMER, QUANTITATIVE: D-Dimer, Quant: 0.5 ug/mL-FEU (ref 0.00–0.50)

## 2020-01-01 LAB — TROPONIN I (HIGH SENSITIVITY): Troponin I (High Sensitivity): 16 ng/L (ref <18)

## 2020-01-01 MED ORDER — HYDROCOD POLST-CPM POLST ER 10-8 MG/5ML PO SUER: 5.0000 mL | Freq: Two times a day (BID) | ORAL | Status: DC | PRN

## 2020-01-01 MED ORDER — TOCILIZUMAB 400 MG/20ML IV SOLN
600.0000 mg | Freq: Once | INTRAVENOUS | Status: AC
  Administered 2020-01-01: 600 mg via INTRAVENOUS
  Filled 2020-01-01: qty 20

## 2020-01-01 MED ORDER — BENZONATATE 100 MG PO CAPS
200.0000 mg | ORAL_CAPSULE | Freq: Three times a day (TID) | ORAL | Status: DC
  Administered 2020-01-01 – 2020-01-04 (×12): 200 mg via ORAL
  Filled 2020-01-01 (×12): qty 2

## 2020-01-01 MED ORDER — BUDESONIDE 180 MCG/ACT IN AEPB
2.0000 | INHALATION_SPRAY | Freq: Two times a day (BID) | RESPIRATORY_TRACT | Status: DC
  Administered 2020-01-01 – 2020-01-05 (×8): 2 via RESPIRATORY_TRACT
  Filled 2020-01-01: qty 1

## 2020-01-01 MED ORDER — FLUTICASONE PROPIONATE 50 MCG/ACT NA SUSP
2.0000 | Freq: Every day | NASAL | Status: DC
  Administered 2020-01-01 – 2020-01-04 (×4): 2 via NASAL
  Filled 2020-01-01: qty 16

## 2020-01-01 MED ORDER — ALBUTEROL SULFATE HFA 108 (90 BASE) MCG/ACT IN AERS
2.0000 | INHALATION_SPRAY | RESPIRATORY_TRACT | Status: DC | PRN
  Filled 2020-01-01: qty 6.7

## 2020-01-01 NOTE — Progress Notes (Addendum)
PROGRESS NOTE                                                                                                                                                                                                             Patient Demographics:    [name], is a 54 y.o. female, DOB - 09/19/1965, RUE:[id number]  Outpatient Primary MD for the patient is [name], Wanda B, MD   Admit date - 12/30/2019   LOS - 2  Chief Complaint  Patient presents with  . Pneumonia       Brief Narrative: Patient is a 54 y.o. female with PMHx of asthma, HTN-who was diagnosed with COVID-19 on 4/16 [facility])-presented with worsening shortness of breath-found to have acute hypoxic respiratory failure secondary to COVID-19 pneumonia.  Significant Events: 4/16>> [facility] (see picture of result in ED note on 4/21)  4/26 >> [facility]  4/28>>Worsening hypoxemia on NRB  COVID-19 medications: Steroids: 4/26>> Remdesivir: 4/26 Actemra: 4/28 x 1  Antibiotics: None  Microbiology data: 4/26>> blood cultures: 1/2 gram-positive cocci  DVT prophylaxis: SQ Lovenox  Procedures: None  Consults: None    Subjective:    [name] today    Assessment  & Plan :   Acute Hypoxic Resp Failure due to Covid 19 Viral pneumonia: Worsening hypoxemia overnight-given Actemra earlier this morning-continue steroids/remdesivir.  Attempt to slowly titrate down FiO2.  Fever: afebrile  O2 requirements:  SpO2: 93 % O2 Flow Rate (L/min): 4 L/min   COVID-19 Labs: Recent Labs    12/30/19 1447 12/31/19 0511 01/01/20 0417  DDIMER 0.52* 0.52* 0.50  FERRITIN 952* 891*  --   LDH 277*  --   --   CRP 10.0* 10.4* 6.3*    No results found for: BNP  Recent Labs  Lab 12/30/19 1447  PROCALCITON <0.10    No results found for: SARSCOV2NAA   Prone/Incentive Spirometry: encouraged patient to  lie prone for 3-4 hours at a time for a total of 16 hours a day, and to encourage incentive spirometry use 3-4/hour.  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   Weight as of this encounter: 77.6 kg.    ABG: No results found for: PHART, PCO2ART, PO2ART, HCO3, TCO2, ACIDBASEDEF, O2SAT  Vent Settings: N/A  Condition - Extremely Guarded  Family Communication  :  Spouse updated over the phone 4/28  Code Status :  Full Code  Diet :  Diet Order            Diet regular Room service appropriate? Yes; Fluid consistency: Thin  Diet effective now               Disposition Plan  :   Status is: Inpatient  Remains inpatient appropriate because:Inpatient level of care appropriate due to severity of illness  Dispo: The patient is from: Home              Anticipated d/c is to: Home              Anticipated d/c date is: > 3 days              Patient currently is not medically stable to d/c.    Barriers to discharge: Hypoxia requiring O2 supplementation/complete 5 days of IV Remdesivir  Antimicorbials  :    Anti-infectives (From admission, onward)   Start     Dose/Rate Route Frequency Ordered Stop   12/31/19 1000  remdesivir 100 mg in sodium chloride 0.9 % 100 mL IVPB     100 mg 200 mL/hr over 30 Minutes Intravenous Daily 12/30/19 1713 01/04/20 0959   12/30/19 1800  remdesivir 200 mg in sodium chloride 0.9% 250 mL IVPB     200 mg 580 mL/hr over 30 Minutes Intravenous Once 12/30/19 1713 12/30/19 2029      Inpatient Medications  Scheduled Meds: . albuterol  2 puff Inhalation Q6H  . benzonatate  200 mg Oral TID  . budesonide (PULMICORT) nebulizer solution  0.25 mg Nebulization BID  .  dexamethasone (DECADRON) injection  10 mg Intravenous Daily  . enoxaparin (LOVENOX) injection  40 mg Subcutaneous Q24H   Continuous Infusions: . remdesivir 100 mg in NS 100 mL Stopped (12/31/19 1445)   PRN Meds:.acetaminophen, albuterol, chlorpheniramine-HYDROcodone, guaiFENesin-dextromethorphan, ondansetron **OR** ondansetron (ZOFRAN) IV   Time Spent in minutes  35  See all Orders from today for further details   01/02/20 M.D on 01/01/2020 at 9:59 AM  To page go to [email or web address] - use universal password  [facility] -  Office  [phone]    Objective:   Vitals:   01/01/20 0500 01/01/20 0545 01/01/20 0615 01/01/20 0700  BP: 121/80 118/81 119/81 115/79  Pulse: 86 84 86 83  Resp: (!) 24 19 19 20   Temp:      TempSrc:      SpO2: 93% 93% 92% 93%  Weight:      Height:        Wt Readings from Last 3 Encounters:  12/30/19 77.6 kg  12/25/19 77.6 kg  04/06/17 86.6 kg    No intake or output data in the 24 hours ending 01/01/20 0959   Physical Exam Gen Exam:Alert awake-not in any distress HEENT:atraumatic, normocephalic Chest: Kelly/L clear to auscultation anteriorly CVS:S1S2 regular Abdomen:soft non tender, non distended Extremities:no edema Neurology: Non focal Skin: no rash   Data Review:    CBC Recent Labs  Lab 12/30/19 1447 12/31/19 0511 01/01/20 0417  WBC 7.2 4.1 6.7  HGB 11.7* 11.3* 11.0*  HCT 35.0* 34.2* 32.2*  PLT 276 284 336  MCV 91.4 92.2 90.7  MCH 30.5 30.5 31.0  MCHC 33.4 33.0 34.2  RDW 12.3 12.5 12.4  LYMPHSABS 1.0 0.6* 0.8  MONOABS 0.4 0.1 0.4  EOSABS 0.0 0.0 0.0  BASOSABS 0.0 0.0 0.0    Chemistries  Recent Labs  Lab 12/30/19 1447 12/31/19 0511 01/01/20 0417  NA 136 138 140  K 3.8 3.8 3.7  CL 101 104 109  CO2 21* 21* 13*  GLUCOSE 97 139* 156*  BUN 19 20 30*  CREATININE 0.84 0.63 0.65  CALCIUM 8.1* 8.2* 8.4*  MG  --  2.4  --   AST 29 28 24   ALT 19 20 20   ALKPHOS 50 46 42  BILITOT 0.8 0.8 0.7    ------------------------------------------------------------------------------------------------------------------ Recent Labs    12/30/19 1447  TRIG 108    Lab Results  Component Value Date   HGBA1C  08/20/2009    5.5 (NOTE) The ADA recommends the following therapeutic goal for glycemic control related to Hgb A1c measurement: Goal of therapy: <6.5 Hgb A1c  Reference: American Diabetes Association: Clinical Practice Recommendations 2010, Diabetes Care, 2010, 33: (Suppl  1).   ------------------------------------------------------------------------------------------------------------------ No results for input(s): TSH, T4TOTAL, T3FREE, THYROIDAB in the last 72 hours.  Invalid input(s): FREET3 ------------------------------------------------------------------------------------------------------------------ Recent Labs    12/30/19 1447 12/31/19 0511  FERRITIN 952* 891*    Coagulation profile No results for input(s): INR, PROTIME in the last 168 hours.  Recent Labs    12/31/19 0511 01/01/20 0417  DDIMER 0.52* 0.50    Cardiac Enzymes No results for input(s): CKMB, TROPONINI, MYOGLOBIN in the last 168 hours.  Invalid input(s): CK ------------------------------------------------------------------------------------------------------------------ No results found for: BNP  Micro Results Recent Results (from the past 240 hour(s))  Blood Culture (routine x 2)     Status: None (Preliminary result)   Collection Time: 12/30/19  2:47 PM   Specimen: BLOOD  Result Value Ref Range Status   Specimen Description BLOOD RIGHT ANTECUBITAL  Final   Special Requests   Final    BOTTLES DRAWN AEROBIC AND ANAEROBIC Blood Culture adequate volume   Culture   Final    NO GROWTH 2 DAYS Performed at [facility], [address], [place], [address] [place]    Report Status PENDING  Incomplete  Blood Culture (routine x 2)     Status: None (Preliminary result)   Collection Time:  12/30/19  2:47 PM   Specimen: BLOOD  Result Value Ref Range Status   Specimen Description BLOOD LEFT ANTECUBITAL  Final   Special Requests   Final    BOTTLES DRAWN AEROBIC AND ANAEROBIC Blood Culture adequate volume   Culture  Setup Time   Final    GRAM POSITIVE COCCI IN CLUSTERS AEROBIC BOTTLE ONLY Organism ID to follow CRITICAL RESULT CALLED TO, READ BACK BY AND VERIFIED WITH: [id number] PharmD 13:20 12/31/19 ([email or web address]) Performed at [facility] Lab, [address], [place], [address] [place]    Culture GRAM POSITIVE COCCI IN CLUSTERS  Final   Report Status PENDING  Incomplete  Blood Culture ID Panel (Reflexed)     Status: Abnormal   Collection Time: 12/30/19  2:47 PM  Result Value Ref Range Status   Enterococcus species NOT DETECTED NOT DETECTED Final   Listeria monocytogenes NOT DETECTED NOT DETECTED Final   Staphylococcus species DETECTED (A) NOT DETECTED Final    Comment: Methicillin (oxacillin) susceptible coagulase negative staphylococcus. Possible blood culture contaminant (unless isolated from more than one blood culture draw or clinical case suggests pathogenicity). No antibiotic treatment is  indicated for blood  culture contaminants. CRITICAL RESULT CALLED TO, READ BACK BY AND VERIFIED WITH: Danella Maiers PharmD 13:20 12/31/19 ([email or web address])    Staphylococcus aureus (BCID) NOT DETECTED NOT DETECTED Final   Methicillin resistance NOT DETECTED NOT DETECTED Final   Streptococcus species NOT DETECTED NOT DETECTED Final   Streptococcus agalactiae NOT DETECTED NOT DETECTED Final   Streptococcus pneumoniae NOT DETECTED NOT DETECTED Final   Streptococcus pyogenes NOT DETECTED NOT DETECTED Final   Acinetobacter baumannii NOT DETECTED NOT DETECTED Final   Enterobacteriaceae species NOT DETECTED NOT DETECTED Final   Enterobacter cloacae complex NOT DETECTED NOT DETECTED Final   Escherichia coli NOT DETECTED NOT DETECTED Final   Klebsiella oxytoca NOT DETECTED NOT DETECTED Final   Klebsiella  pneumoniae NOT DETECTED NOT DETECTED Final   Proteus species NOT DETECTED NOT DETECTED Final   Serratia marcescens NOT DETECTED NOT DETECTED Final   Haemophilus influenzae NOT DETECTED NOT DETECTED Final   Neisseria meningitidis NOT DETECTED NOT DETECTED Final   Pseudomonas aeruginosa NOT DETECTED NOT DETECTED Final   Candida albicans NOT DETECTED NOT DETECTED Final   Candida glabrata NOT DETECTED NOT DETECTED Final   Candida krusei NOT DETECTED NOT DETECTED Final   Candida parapsilosis NOT DETECTED NOT DETECTED Final   Candida tropicalis NOT DETECTED NOT DETECTED Final    Comment: Performed at [facility], [address], [place], [place] [address]    Radiology Reports CT CHEST WO CONTRAST  Result Date: 12/30/2019 CLINICAL DATA:  Unresolved pneumonia. Diagnosed with COVID 415. Worsening respiratory symptoms. Hypoxia. EXAM: CT CHEST WITHOUT CONTRAST TECHNIQUE: Multidetector CT imaging of the chest was performed following the standard protocol without IV contrast. COMPARISON:  Radiograph earlier this day, also 12/25/2019. FINDINGS: Cardiovascular: Thoracic aorta is normal in caliber. Minor aortic atherosclerosis. Heart is normal in size. No pericardial effusion. Mediastinum/Nodes: Limited assessment for adenopathy given low lung volumes, patient motion, and lack of contrast. Scattered prominent mediastinal lymph nodes. Cannot assess for hilar adenopathy given lack of IV contrast. Distal esophagus slightly patulous. No visualized thyroid nodule. Lungs/Pleura: Low lung volumes with bibasilar dependent atelectasis/collapse. Heterogeneous bilateral peripheral opacities in the mid lower lung zone predominant distribution. No pleural fluid. No evidence of pulmonary edema. Trachea and mainstem bronchi are patent. Upper Abdomen: Mild bilateral perinephric edema is nonspecific. Musculoskeletal: There are no acute or suspicious osseous abnormalities. IMPRESSION: Low lung volumes with bibasilar dependent  atelectasis/partial collapse. Heterogeneous bilateral peripheral opacities in the mid lower lung zone predominant distribution, consistent with COVID-19 pneumonia. Aortic Atherosclerosis (ICD10-I70.0). Electronically Signed   By: Narda Rutherford M.D.   On: 12/30/2019 19:01   DG CHEST PORT 1 VIEW  Result Date: 01/01/2020 CLINICAL DATA:  Increased fatigue.  COVID-19 EXAM: PORTABLE CHEST 1 VIEW COMPARISON:  Two days ago FINDINGS: Low volume chest with patchy pulmonary infiltrate asymmetric to the subpleural left lung. Normal heart size for low volume inflation. No visible effusion or pneumothorax. IMPRESSION: Low volume chest with atypical pneumonia, slightly increased on the left when compared to 2 days ago. Electronically Signed   By: Marnee Spring M.D.   On: 01/01/2020 05:27   DG Chest Port 1 View  Result Date: 12/30/2019 CLINICAL DATA:  COVID, hypoxia. EXAM: PORTABLE CHEST 1 VIEW COMPARISON:  Prior chest radiographs 12/25/2019 and earlier FINDINGS: Heart size within normal limits. Aortic atherosclerosis. Ill-defined airspace disease within the left mid to lower lung field has somewhat progressed. New from prior exam, there is ill-defined airspace disease within the right lung base. Small amount  of fluid now tracking within the minor fissure. No definite pleural effusion. No evidence of pneumothorax. No acute bony abnormality. IMPRESSION: Ill-defined airspace disease within the left mid to lower lung, progressed. New airspace disease within the right lung base. Findings are consistent with multifocal pneumonia. Electronically Signed   By: Kellie Simmering DO   On: 12/30/2019 14:37   DG Chest Port 1 View  Result Date: 12/25/2019 CLINICAL DATA:  Cough. COVID positive. EXAM: PORTABLE CHEST 1 VIEW COMPARISON:  Two-view chest x-ray 03/05/2017 FINDINGS: The heart size is normal. New left lower lobe airspace disease is present. Upper lobes are clear bilaterally. Right lung is clear. IMPRESSION: New left lower  lobe airspace disease compatible with pneumonia. Electronically Signed   By: San Morelle M.D.   On: 12/25/2019 06:32

## 2020-01-01 NOTE — Progress Notes (Signed)
[name] is a 54 y.o. female patient admitted from ED awake, alert - oriented  X 4 - no acute distress noted.  VSS - Blood pressure 135/89, pulse 86, temperature 98.6 F (37 C), temperature source Axillary, resp. rate (!) 26, height 5\' 2"  (1.575 m), weight 77.6 kg, last menstrual period 06/26/2013, SpO2 91 %.    IV in place, occlusive dsg intact without redness.  Orientation to room, and floor completed with information packet given to patient/family.  Patient declined safety video at this time.  Admission INP armband ID verified with patient/family, and in place.   SR up x 2, fall assessment complete, with patient and family able to verbalize understanding of risk associated with falls, and verbalized understanding to call nsg before up out of bed.  Call light within reach, patient able to voice, and demonstrate understanding.  Skin, clean-dry- intact without evidence of bruising, or skin tears.   No evidence of skin break down noted on exam.     Will cont to eval and treat per MD orders.  06/28/2013, RN 01/01/2020 10:44 AM

## 2020-01-01 NOTE — ED Notes (Signed)
Breakfast tray at bedside 

## 2020-01-02 ENCOUNTER — Inpatient Hospital Stay (HOSPITAL_COMMUNITY): Payer: No Typology Code available for payment source

## 2020-01-02 LAB — CBC WITH DIFFERENTIAL/PLATELET
Abs Immature Granulocytes: 0.07 10*3/uL (ref 0.00–0.07)
Basophils Absolute: 0 10*3/uL (ref 0.0–0.1)
Basophils Relative: 0 %
Eosinophils Absolute: 0 10*3/uL (ref 0.0–0.5)
Eosinophils Relative: 0 %
HCT: 29.9 % — ABNORMAL LOW (ref 36.0–46.0)
Hemoglobin: 10.2 g/dL — ABNORMAL LOW (ref 12.0–15.0)
Immature Granulocytes: 1 %
Lymphocytes Relative: 7 %
Lymphs Abs: 0.8 10*3/uL (ref 0.7–4.0)
MCH: 31.4 pg (ref 26.0–34.0)
MCHC: 34.1 g/dL (ref 30.0–36.0)
MCV: 92 fL (ref 80.0–100.0)
Monocytes Absolute: 0.5 10*3/uL (ref 0.1–1.0)
Monocytes Relative: 5 %
Neutro Abs: 8.7 10*3/uL — ABNORMAL HIGH (ref 1.7–7.7)
Neutrophils Relative %: 87 %
Platelets: 378 10*3/uL (ref 150–400)
RBC: 3.25 MIL/uL — ABNORMAL LOW (ref 3.87–5.11)
RDW: 12.6 % (ref 11.5–15.5)
WBC: 10.1 10*3/uL (ref 4.0–10.5)
nRBC: 0 % (ref 0.0–0.2)

## 2020-01-02 LAB — COMPREHENSIVE METABOLIC PANEL
ALT: 20 U/L (ref 0–44)
AST: 20 U/L (ref 15–41)
Albumin: 2.5 g/dL — ABNORMAL LOW (ref 3.5–5.0)
Alkaline Phosphatase: 40 U/L (ref 38–126)
Anion gap: 8 (ref 5–15)
BUN: 31 mg/dL — ABNORMAL HIGH (ref 6–20)
CO2: 23 mmol/L (ref 22–32)
Calcium: 8.2 mg/dL — ABNORMAL LOW (ref 8.9–10.3)
Chloride: 107 mmol/L (ref 98–111)
Creatinine, Ser: 0.63 mg/dL (ref 0.44–1.00)
GFR calc Af Amer: >60 mL/min (ref >60)
GFR calc non Af Amer: >60 mL/min (ref >60)
Glucose, Bld: 203 mg/dL — ABNORMAL HIGH (ref 70–99)
Potassium: 3.7 mmol/L (ref 3.5–5.1)
Sodium: 138 mmol/L (ref 135–145)
Total Bilirubin: 0.4 mg/dL (ref 0.3–1.2)
Total Protein: 6 g/dL — ABNORMAL LOW (ref 6.5–8.1)

## 2020-01-02 LAB — CULTURE, BLOOD (ROUTINE X 2): Special Requests: ADEQUATE

## 2020-01-02 LAB — BRAIN NATRIURETIC PEPTIDE: B Natriuretic Peptide: 68.9 pg/mL (ref 0.0–100.0)

## 2020-01-02 LAB — D-DIMER, QUANTITATIVE: D-Dimer, Quant: 0.42 ug/mL-FEU (ref 0.00–0.50)

## 2020-01-02 LAB — C-REACTIVE PROTEIN: CRP: 2.8 mg/dL — ABNORMAL HIGH (ref <1.0)

## 2020-01-02 MED ORDER — CLONAZEPAM 0.25 MG PO TBDP
0.2500 mg | ORAL_TABLET | Freq: Two times a day (BID) | ORAL | Status: DC | PRN
  Administered 2020-01-03 – 2020-01-05 (×3): 0.25 mg via ORAL
  Filled 2020-01-02 (×3): qty 1

## 2020-01-02 MED ORDER — OXYMETAZOLINE HCL 0.05 % NA SOLN
2.0000 | Freq: Two times a day (BID) | NASAL | Status: DC | PRN
  Administered 2020-01-02 – 2020-01-04 (×3): 2 via NASAL
  Filled 2020-01-02: qty 30

## 2020-01-02 NOTE — Progress Notes (Signed)
Occupational Therapy Evaluation Patient Details Name: [name] MRN: [id number] DOB: [birth year] Today's Date: 01/02/2020    History of Present Illness 54 year old female admitted 12/30/19 with worsening SOB. Diagnosed with COVID 4/16 at [facility]. Patient presented to ED 4/21 with fatigue and sent home. Patient now with  acute hypoxic respiratory failure secondary to COVID-19 pneumonia requiring NRB on admission, now on HFNC. Steroids and Remdesivir started 12/30/19. Actemra 01/01/20. PMH: asthma bronchitis, COPD, former smoker     Clinical Impression   PTA, pt lives with husband and was Independent in all ADLs, IADLs, and mobility without AD. Pt was driving and working at [employer]. Pt now received on 10 L HFNC, SOB and SpoO2 90%/RR at 26 on therapist entry. Pt overall Supervision for bed mobility, min guard for sit to stands, Min A for steadying during stand pivot transfer to recliner as pt tremulous. Pt min guard to don socks sitting EOB, min guard for peri care in standing without AD. During activity, pt desats to 80% on 10 L HFNC, requiring 4 min to recover to 88%. Pt with deficits in strength and cardiopulmonary tolerance impacting ability to safely complete ADLs/mobility without assistance. Recommend 3 in 1 and HHOT to follow-up at DC. Will continue to follow acutely.    Follow Up Recommendations  Home health OT;Supervision/Assistance - 24 hour    Equipment Recommendations  3 in 1 bedside commode    Recommendations for Other Services       Precautions / Restrictions Precautions Precautions: Fall;Other (comment) Precaution Comments: monitor oxygen saturation, O2 >88% Restrictions Weight Bearing Restrictions: No      Mobility Bed Mobility Overal bed mobility: Needs Assistance Bed Mobility: Supine to Sit     Supine to sit: Supervision(use of bedrail)        Transfers Overall transfer level: Needs assistance Equipment used: None Transfers:  Sit to/from [employer] Sit to Stand: Min guard;Min assist Stand pivot transfers: Min assist;Min guard       General transfer comment: stand-step transfer EOB>chair, sit<>stand from chair    Balance Overall balance assessment: Needs assistance Sitting-balance support: Feet supported Sitting balance-Leahy Scale: Good     Standing balance support: No upper extremity supported;Single extremity supported Standing balance-Leahy Scale: Fair Standing balance comment: tremulous                            ADL either performed or assessed with clinical judgement   ADL Overall ADL's : Needs assistance/impaired Eating/Feeding: Set up;Sitting   Grooming: Set up;Sitting   Upper Body Bathing: Set up;Sitting   Lower Body Bathing: Minimal assistance;Sit to/from stand;Sitting/lateral leans Lower Body Bathing Details (indicate cue type and reason): min guard for peri care in standing after urine incontinence. suspect min A would be needed for full LB bathing tasks due to decreased endurance Upper Body Dressing : Set up;Sitting   Lower Body Dressing: Minimal assistance;Sit to/from stand;Sitting/lateral leans Lower Body Dressing Details (indicate cue type and reason): min guard for donning socks EOB with increased time. Pt may require Min A for full LB dressing tasks due to decreased cardiopulmonary tolerance  Toilet Transfer: Minimal assistance;Stand-pivot;BSC   Toileting- Clothing Manipulation and Hygiene: Min guard;Sitting/lateral lean;Sit to/from stand         General ADL Comments: Min A at most for ADLs due to decreased cardiopulmonary tolerance and decreased strength.      Vision         Perception  Praxis      Pertinent Vitals/Pain Pain Assessment: No/denies pain     Hand Dominance Right   Extremity/Trunk Assessment Upper Extremity Assessment Upper Extremity Assessment: Generalized weakness   Lower Extremity Assessment Lower Extremity  Assessment: Defer to PT evaluation   Cervical / Trunk Assessment Cervical / Trunk Assessment: Normal   Communication Communication Communication: No difficulties   Cognition Arousal/Alertness: Awake/alert Behavior During Therapy: WFL for tasks assessed/performed Overall Cognitive Status: Within Functional Limits for tasks assessed                                     General Comments  Patient on 10L HFNC. SOB/increased RR upon entry (RR 26) but did decreased with simple questions/distraction. O2 saturation 89-90% at rest in bed. O2 saturation 86-87% sitting EOB, HR 99 bpm, RR 28. After transfer to chair, O2 sat 86%, HR 88 bpm. After sit<>stand from chair, oxygen saturation 80%-->increased to 86%, HR 90 bpm, RR 28 after 2 minutes seated reclined rest -->88% after 4 minutes seated reclined rest    Exercises Exercises: Other exercises Other Exercises Other Exercises: Incentive spirometer ( ) x 3 Other Exercises: pursed lip breathing    Shoulder Instructions      Home Living Family/patient expects to be discharged to:: Private residence Living Arrangements: Spouse/significant other Available Help at Discharge: Family Type of Home: House Home Access: Stairs to enter Secretary/administrator of Steps: 5 Entrance Stairs-Rails: Can reach both Home Layout: One level     Bathroom Shower/Tub: Walk-in shower;Tub only   Bathroom Toilet: Standard     Home Equipment: None   Additional Comments: Patient's husband had COVID but has recovered.      Prior Functioning/Environment Level of Independence: Independent        Comments: Pt independent for all ADLs, IADLs, mobility and driving. Pt was working in optometrist office, fitting glasses        OT Problem List: Decreased strength;Decreased activity tolerance;Impaired balance (sitting and/or standing);Cardiopulmonary status limiting activity      OT Treatment/Interventions: Self-care/ADL training;Therapeutic  exercise;Energy conservation;DME and/or AE instruction;Therapeutic activities;Patient/family education    OT Goals(Current goals can be found in the care plan section) Acute Rehab OT Goals Patient Stated Goal: get better, go home to husband  OT Goal Formulation: With patient Time For Goal Achievement: 01/16/20 Potential to Achieve Goals: Good ADL Goals Pt Will Perform Grooming: with supervision;standing Pt Will Perform Upper Body Bathing: with modified independence;sitting Pt Will Perform Lower Body Bathing: with supervision;sit to/from stand;sitting/lateral leans Pt Will Transfer to Toilet: with modified independence;stand pivot transfer;bedside commode Pt Will Perform Toileting - Clothing Manipulation and hygiene: with modified independence;sitting/lateral leans;sit to/from stand Additional ADL Goal #1: Pt to verbalize at least 3 energy conservation strategies to implement during daily tasks in order to maximize ADL/mobility independence.  OT Frequency: Min 3X/week   Barriers to D/C:            Co-evaluation PT/OT/SLP Co-Evaluation/Treatment: Yes Reason for Co-Treatment: Complexity of the patient's impairments (multi-system involvement);For patient/therapist safety;Other (comment)(decreased activity tolerance)   OT goals addressed during session: ADL's and self-care      AM-PAC OT "6 Clicks" Daily Activity     Outcome Measure Help from another person eating meals?: A Little Help from another person taking care of personal grooming?: A Little Help from another person toileting, which includes using toliet, bedpan, or urinal?: A Little Help from another person bathing (including washing, rinsing,  drying)?: A Little Help from another person to put on and taking off regular upper body clothing?: A Little Help from another person to put on and taking off regular lower body clothing?: A Little 6 Click Score: 18   End of Session Equipment Utilized During Treatment: Oxygen Nurse  Communication: Mobility status  Activity Tolerance: Patient tolerated treatment well;Patient limited by fatigue Patient left: in chair;with call bell/phone within reach;with chair alarm set  OT Visit Diagnosis: Unsteadiness on feet (R26.81);Muscle weakness (generalized) (M62.81);Other (comment)(Decreased cardiopulmonary tolerance )                Time: 4332-9518 OT Time Calculation (min): 48 min Charges:  OT General Charges $OT Visit: 1 Visit OT Evaluation $OT Eval Moderate Complexity: 1 Mod OT Treatments $Self Care/Home Management : 8-22 mins  Layla Maw, OTR/L  Layla Maw 01/02/2020, 1:43 PM

## 2020-01-02 NOTE — Plan of Care (Signed)
  Problem: Health Behavior/Discharge Planning: Goal: Ability to manage health-related needs will improve Outcome: Progressing   Problem: Clinical Measurements: Goal: Will remain free from infection Outcome: Progressing Goal: Cardiovascular complication will be avoided Outcome: Progressing   Problem: Nutrition: Goal: Adequate nutrition will be maintained Outcome: Progressing   Problem: Safety: Goal: Ability to remain free from injury will improve Outcome: Progressing   

## 2020-01-02 NOTE — Progress Notes (Signed)
PROGRESS NOTE                                                                                                                                                                                                             Patient Demographics:    [name], is a 54 y.o. female, DOB - 09/07/1965, ZOX:[id number]  Outpatient Primary MD for the patient is Marcine Matar, MD   Admit date - 12/30/2019   LOS - 3  Chief Complaint  Patient presents with  . Pneumonia       Brief Narrative: Patient is a 54 y.o. female with PMHx of asthma, HTN-who was diagnosed with COVID-19 on 4/16 [facility])-presented with worsening shortness of breath-found to have acute hypoxic respiratory failure secondary to COVID-19 pneumonia.  Significant Events: 4/16>> COVID-19 positive at [facility] (see picture of result in ED note on 4/21)  4/26 >> admit to [facility]  4/28>>Worsening hypoxemia on NRB  COVID-19 medications: Steroids: 4/26>> Remdesivir: 4/26 Actemra: 4/28 x 1  Antibiotics: None  Microbiology data: 4/26>> blood cultures: 1/2 gram-positive cocci 4/26>> blood cultures: No growth 4/28>> blood cultures: No growth  DVT prophylaxis: SQ Lovenox  Procedures: None  Consults: None    Subjective:    [name] today gets short of breath with minimal activity-she remains on 10 L of oxygen.   Assessment  & Plan :   Acute Hypoxic Resp Failure due to Covid 19 Viral pneumonia: Some improvement compared to yesterday-down to 10 L of oxygen.  But does get short of breath with minimal activity.  No signs of volume overload on exam-BNP within normal limits.  D-dimer within normal limits as well.  Repeated chest x-ray continues to show unchanged infiltrates.  Continue steroids/remdesivir-follow inflammatory markers-continue to attempt to slowly titrate down FiO2.    Fever: afebrile  O2 requirements:  SpO2:  (!) 89 % O2 Flow Rate (L/min): 10 L/min   COVID-19 Labs: Recent Labs    12/30/19 1447 12/30/19 1447 12/31/19 0511 01/01/20 0417 01/02/20 0522  DDIMER 0.52*   < > 0.52* 0.50 0.42  FERRITIN 952*  --  891*  --   --   LDH 277*  --   --   --   --   CRP 10.0*   < > 10.4* 6.3* 2.8*   < > = values  in this interval not displayed.       Component Value Date/Time   BNP 68.9 01/02/2020 0522    Recent Labs  Lab 12/30/19 1447  PROCALCITON <0.10    No results found for: SARSCOV2NAA   Prone/Incentive Spirometry: encouraged patient to lie prone for 3-4 hours at a time for a total of 16 hours a day, and to encourage incentive spirometry use 3-4/hour.  Coag negative staph bacteremia: Likely contaminant-await final culture results-however and repeat blood cultures on 4/28 negative so far.  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   Weight as of this encounter: 77.6 kg.    ABG: No results found for: PHART, PCO2ART, PO2ART, HCO3, TCO2, ACIDBASEDEF, O2SAT  Vent Settings: N/A  Condition - Extremely Guarded  Family Communication  :  Spouse updated over the phone 4/29  Code Status :  Full Code  Diet :  Diet Order            Diet regular Room service appropriate? Yes; Fluid consistency: Thin  Diet effective now               Disposition Plan  :   Status is: Inpatient  Remains inpatient appropriate because:Inpatient level of care appropriate due to severity of illness  Dispo: The patient is from: Home              Anticipated d/c is to: Home              Anticipated d/c date is: > 3 days              Patient currently is not medically stable to d/c.   Barriers to discharge: Hypoxia requiring O2 supplementation/complete 5 days of IV  Remdesivir  Antimicorbials  :    Anti-infectives (From admission, onward)   Start     Dose/Rate Route Frequency Ordered Stop   12/31/19 1000  remdesivir 100 mg in sodium chloride 0.9 % 100 mL IVPB     100 mg 200 mL/hr over 30 Minutes Intravenous Daily 12/30/19 1713 01/04/20 0959   12/30/19 1800  remdesivir 200 mg in sodium chloride 0.9% 250 mL IVPB     200 mg 580 mL/hr over 30 Minutes Intravenous Once 12/30/19 1713 12/30/19 2029      Inpatient Medications  Scheduled Meds: . albuterol  2 puff Inhalation Q6H  . benzonatate  200 mg Oral TID  . budesonide  2 puff Inhalation BID  . dexamethasone (DECADRON) injection  10 mg Intravenous Daily  . enoxaparin (LOVENOX) injection  40 mg Subcutaneous Q24H  . fluticasone  2 spray Each Nare Daily   Continuous Infusions: . remdesivir 100 mg in NS 100 mL 100 mg (01/02/20 0847)   PRN Meds:.acetaminophen, albuterol, chlorpheniramine-HYDROcodone, guaiFENesin-dextromethorphan, ondansetron **OR** ondansetron (ZOFRAN) IV   Time Spent in minutes  35  See all Orders from today for further details   01/04/20 M.D on 01/02/2020 at 11:31 AM  To page go to [email or web address] - use universal password  [facility] -  Office  [phone]    Objective:   Vitals:   01/01/20 2001 01/01/20 2003 01/01/20 2140 01/02/20 0400  BP: 124/80 124/80 107/76 102/63  Pulse: 90 88 86   Resp:  14 (!) 21   Temp: 98.7 F (37.1 C) 98.7 F (37.1 C) 98.1 F (  36.7 C) 98.8 F (37.1 C)  TempSrc: Oral Oral Oral Oral  SpO2: 91% (!) 88% (!) 89%   Weight:      Height:        Wt Readings from Last 3 Encounters:  12/30/19 77.6 kg  12/25/19 77.6 kg  04/06/17 86.6 kg     Intake/Output Summary (Last 24 hours) at 01/02/2020 1131 Last data filed at 01/02/2020 0900 Gross per 24 hour  Intake 316.75 ml  Output 400 ml  Net -83.25 ml     Physical Exam Gen Exam:Alert awake-not in any distress HEENT:atraumatic, normocephalic Chest: B/L clear to  auscultation anteriorly CVS:S1S2 regular Abdomen:soft non tender, non distended Extremities:no edema Neurology: Non focal Skin: no rash   Data Review:    CBC Recent Labs  Lab 12/30/19 1447 12/31/19 0511 01/01/20 0417 01/02/20 0522  WBC 7.2 4.1 6.7 10.1  HGB 11.7* 11.3* 11.0* 10.2*  HCT 35.0* 34.2* 32.2* 29.9*  PLT 276 284 336 378  MCV 91.4 92.2 90.7 92.0  MCH 30.5 30.5 31.0 31.4  MCHC 33.4 33.0 34.2 34.1  RDW 12.3 12.5 12.4 12.6  LYMPHSABS 1.0 0.6* 0.8 0.8  MONOABS 0.4 0.1 0.4 0.5  EOSABS 0.0 0.0 0.0 0.0  BASOSABS 0.0 0.0 0.0 0.0    Chemistries  Recent Labs  Lab 12/30/19 1447 12/31/19 0511 01/01/20 0417 01/02/20 0522  NA 136 138 140 138  K 3.8 3.8 3.7 3.7  CL 101 104 109 107  CO2 21* 21* 13* 23  GLUCOSE 97 139* 156* 203*  BUN 19 20 30* 31*  CREATININE 0.84 0.63 0.65 0.63  CALCIUM 8.1* 8.2* 8.4* 8.2*  MG  --  2.4  --   --   AST 29 28 24 20   ALT 19 20 20 20   ALKPHOS 50 46 42 40  BILITOT 0.8 0.8 0.7 0.4   ------------------------------------------------------------------------------------------------------------------ Recent Labs    12/30/19 1447  TRIG 108    Lab Results  Component Value Date   HGBA1C  08/20/2009    5.5 (NOTE) The ADA recommends the following therapeutic goal for glycemic control related to Hgb A1c measurement: Goal of therapy: <6.5 Hgb A1c  Reference: American Diabetes Association: Clinical Practice Recommendations 2010, Diabetes Care, 2010, 33: (Suppl  1).   ------------------------------------------------------------------------------------------------------------------ No results for input(s): TSH, T4TOTAL, T3FREE, THYROIDAB in the last 72 hours.  Invalid input(s): FREET3 ------------------------------------------------------------------------------------------------------------------ Recent Labs    12/30/19 1447 12/31/19 0511  FERRITIN 952* 891*    Coagulation profile No results for input(s): INR, PROTIME in the last  168 hours.  Recent Labs    01/01/20 0417 01/02/20 0522  DDIMER 0.50 0.42    Cardiac Enzymes No results for input(s): CKMB, TROPONINI, MYOGLOBIN in the last 168 hours.  Invalid input(s): CK ------------------------------------------------------------------------------------------------------------------    Component Value Date/Time   BNP 68.9 01/02/2020 0522    Micro Results Recent Results (from the past 240 hour(s))  Blood Culture (routine x 2)     Status: None (Preliminary result)   Collection Time: 12/30/19  2:47 PM   Specimen: BLOOD  Result Value Ref Range Status   Specimen Description BLOOD RIGHT ANTECUBITAL  Final   Special Requests   Final    BOTTLES DRAWN AEROBIC AND ANAEROBIC Blood Culture adequate volume   Culture   Final    NO GROWTH 3 DAYS Performed at [facility], [address] [place], [place] [address]    Report Status PENDING  Incomplete  Blood Culture (routine x 2)  Status: Abnormal   Collection Time: 12/30/19  2:47 PM   Specimen: BLOOD  Result Value Ref Range Status   Specimen Description BLOOD LEFT ANTECUBITAL  Final   Special Requests   Final    BOTTLES DRAWN AEROBIC AND ANAEROBIC Blood Culture adequate volume   Culture  Setup Time   Final    GRAM POSITIVE COCCI IN CLUSTERS AEROBIC BOTTLE ONLY Organism ID to follow CRITICAL RESULT CALLED TO, READ BACK BY AND VERIFIED WITH: Danella Maiers PharmD 13:20 12/31/19 ([email or web address])    Culture (A)  Final    STAPHYLOCOCCUS SPECIES (COAGULASE NEGATIVE) THE SIGNIFICANCE OF ISOLATING THIS ORGANISM FROM A SINGLE SET OF BLOOD CULTURES WHEN MULTIPLE SETS ARE DRAWN IS UNCERTAIN. PLEASE NOTIFY THE MICROBIOLOGY DEPARTMENT WITHIN ONE WEEK IF SPECIATION AND SENSITIVITIES ARE REQUIRED. Performed at [facility], [address] [place], [place] [address]    Report Status 01/02/2020 FINAL  Final  Blood Culture ID Panel (Reflexed)     Status: Abnormal   Collection Time: 12/30/19  2:47 PM  Result Value Ref Range  Status   Enterococcus species NOT DETECTED NOT DETECTED Final   Listeria monocytogenes NOT DETECTED NOT DETECTED Final   Staphylococcus species DETECTED (A) NOT DETECTED Final    Comment: Methicillin (oxacillin) susceptible coagulase negative staphylococcus. Possible blood culture contaminant (unless isolated from more than one blood culture draw or clinical case suggests pathogenicity). No antibiotic treatment is indicated for blood  culture contaminants. CRITICAL RESULT CALLED TO, READ BACK BY AND VERIFIED WITH: Danella Maiers PharmD 13:20 12/31/19 ([email or web address])    Staphylococcus aureus (BCID) NOT DETECTED NOT DETECTED Final   Methicillin resistance NOT DETECTED NOT DETECTED Final   Streptococcus species NOT DETECTED NOT DETECTED Final   Streptococcus agalactiae NOT DETECTED NOT DETECTED Final   Streptococcus pneumoniae NOT DETECTED NOT DETECTED Final   Streptococcus pyogenes NOT DETECTED NOT DETECTED Final   Acinetobacter baumannii NOT DETECTED NOT DETECTED Final   Enterobacteriaceae species NOT DETECTED NOT DETECTED Final   Enterobacter cloacae complex NOT DETECTED NOT DETECTED Final   Escherichia coli NOT DETECTED NOT DETECTED Final   Klebsiella oxytoca NOT DETECTED NOT DETECTED Final   Klebsiella pneumoniae NOT DETECTED NOT DETECTED Final   Proteus species NOT DETECTED NOT DETECTED Final   Serratia marcescens NOT DETECTED NOT DETECTED Final   Haemophilus influenzae NOT DETECTED NOT DETECTED Final   Neisseria meningitidis NOT DETECTED NOT DETECTED Final   Pseudomonas aeruginosa NOT DETECTED NOT DETECTED Final   Candida albicans NOT DETECTED NOT DETECTED Final   Candida glabrata NOT DETECTED NOT DETECTED Final   Candida krusei NOT DETECTED NOT DETECTED Final   Candida parapsilosis NOT DETECTED NOT DETECTED Final   Candida tropicalis NOT DETECTED NOT DETECTED Final    Comment: Performed at [facility], [address], [place], [place] [address]  Culture, blood (Routine X 2) w Reflex to  ID Panel     Status: None (Preliminary result)   Collection Time: 01/01/20  6:24 PM   Specimen: BLOOD RIGHT HAND  Result Value Ref Range Status   Specimen Description BLOOD RIGHT HAND  Final   Special Requests   Final    BOTTLES DRAWN AEROBIC AND ANAEROBIC Blood Culture adequate volume   Culture   Final    NO GROWTH < 12 HOURS Performed at [facility], [address] [place], [place] [address]    Report Status PENDING  Incomplete  Culture, blood (Routine X 2) w Reflex to ID Panel  Status: None (Preliminary result)   Collection Time: 01/01/20  6:31 PM   Specimen: BLOOD LEFT HAND  Result Value Ref Range Status   Specimen Description BLOOD LEFT HAND  Final   Special Requests   Final    BOTTLES DRAWN AEROBIC AND ANAEROBIC Blood Culture adequate volume   Culture   Final    NO GROWTH < 12 HOURS Performed at [facility], [address] [place], [place] [address]    Report Status PENDING  Incomplete    Radiology Reports CT CHEST WO CONTRAST  Result Date: 12/30/2019 CLINICAL DATA:  Unresolved pneumonia. Diagnosed with COVID 415. Worsening respiratory symptoms. Hypoxia. EXAM: CT CHEST WITHOUT CONTRAST TECHNIQUE: Multidetector CT imaging of the chest was performed following the standard protocol without IV contrast. COMPARISON:  Radiograph earlier this day, also 12/25/2019. FINDINGS: Cardiovascular: Thoracic aorta is normal in caliber. Minor aortic atherosclerosis. Heart is normal in size. No pericardial effusion. Mediastinum/Nodes: Limited assessment for adenopathy given low lung volumes, patient motion, and lack of contrast. Scattered prominent mediastinal lymph nodes. Cannot assess for hilar adenopathy given lack of IV contrast. Distal esophagus slightly patulous. No visualized thyroid nodule. Lungs/Pleura: Low lung volumes with bibasilar dependent atelectasis/collapse. Heterogeneous bilateral peripheral opacities in the mid lower lung zone predominant distribution. No pleural  fluid. No evidence of pulmonary edema. Trachea and mainstem bronchi are patent. Upper Abdomen: Mild bilateral perinephric edema is nonspecific. Musculoskeletal: There are no acute or suspicious osseous abnormalities. IMPRESSION: Low lung volumes with bibasilar dependent atelectasis/partial collapse. Heterogeneous bilateral peripheral opacities in the mid lower lung zone predominant distribution, consistent with COVID-19 pneumonia. Aortic Atherosclerosis (ICD10-I70.0). Electronically Signed   By: Narda Rutherford M.D.   On: 12/30/2019 19:01   DG CHEST PORT 1 VIEW  Result Date: 01/01/2020 CLINICAL DATA:  Increased fatigue.  COVID-19 EXAM: PORTABLE CHEST 1 VIEW COMPARISON:  Two days ago FINDINGS: Low volume chest with patchy pulmonary infiltrate asymmetric to the subpleural left lung. Normal heart size for low volume inflation. No visible effusion or pneumothorax. IMPRESSION: Low volume chest with atypical pneumonia, slightly increased on the left when compared to 2 days ago. Electronically Signed   By: Marnee Spring M.D.   On: 01/01/2020 05:27   DG Chest Port 1 View  Result Date: 12/30/2019 CLINICAL DATA:  COVID, hypoxia. EXAM: PORTABLE CHEST 1 VIEW COMPARISON:  Prior chest radiographs 12/25/2019 and earlier FINDINGS: Heart size within normal limits. Aortic atherosclerosis. Ill-defined airspace disease within the left mid to lower lung field has somewhat progressed. New from prior exam, there is ill-defined airspace disease within the right lung base. Small amount of fluid now tracking within the minor fissure. No definite pleural effusion. No evidence of pneumothorax. No acute bony abnormality. IMPRESSION: Ill-defined airspace disease within the left mid to lower lung, progressed. New airspace disease within the right lung base. Findings are consistent with multifocal pneumonia. Electronically Signed   By: Jackey Loge DO   On: 12/30/2019 14:37   DG Chest Port 1 View  Result Date: 12/25/2019 CLINICAL  DATA:  Cough. COVID positive. EXAM: PORTABLE CHEST 1 VIEW COMPARISON:  Two-view chest x-ray 03/05/2017 FINDINGS: The heart size is normal. New left lower lobe airspace disease is present. Upper lobes are clear bilaterally. Right lung is clear. IMPRESSION: New left lower lobe airspace disease compatible with pneumonia. Electronically Signed   By: Marin Roberts M.D.   On: 12/25/2019 06:32   DG Chest Port 1V same Day  Result Date: 01/02/2020 CLINICAL DATA:  Shortness of breath,  history hypertension, former smoker, COVID-19 pneumonia EXAM: PORTABLE CHEST 1 VIEW COMPARISON:  Portable exam 1040 hours compared to 01/01/2020 FINDINGS: Normal heart size, mediastinal contours, and pulmonary vascularity. Bibasilar atelectasis with patchy infiltrates in the periphery of the mid to lower lungs bilaterally. Upper lungs clear. No pleural effusion or pneumothorax. IMPRESSION: Persistent peripheral infiltrates in the mid to lower lungs consistent with COVID-19 pneumonia. Bibasilar atelectasis. Electronically Signed   By: Ulyses SouthwardMark  Boles M.D.   On: 01/02/2020 10:48

## 2020-01-02 NOTE — Evaluation (Signed)
Physical Therapy Evaluation Patient Details Name: [name] MRN: [id number] DOB: 13-Jan-1966 Today's Date: 01/02/2020   History of Present Illness  54 year old female admitted 12/30/19 with worsening SOB. Diagnosed with COVID 4/16 at [facility]. Patient presented to ED 4/21 with fatigue and sent home. Patient now with  acute hypoxic respiratory failure secondary to COVID-19 pneumonia requiring NRB on admission, now on HFNC. Steroids and Remdesivir started 12/30/19. Actemra 01/01/20. PMH: asthma bronchitis, COPD, former smoker      Clinical Impression  Patient presents with impaired activity tolerance and desats to 86% with talking or 80% with minimal mobility on 10L HFNC. MD aware. Recommend continued skilled PT services and pending mobility progress and ability to wean down on oxygen recommend home with PT services, DME TBD. Patient's husband also had COVID and has recovered but need to clarify if he will be home with patient at time of discharge or if he will be back to work by that time.    Follow Up Recommendations Home health PT;Supervision/Assistance - 24 hour    Equipment Recommendations  Other (comment)(TBD)       Precautions / Restrictions Precautions Precautions: Fall;Other (comment) Precaution Comments: monitor oxygen saturation, O2 >88% Restrictions Weight Bearing Restrictions: No      Mobility  Bed Mobility Overal bed mobility: Needs Assistance Bed Mobility: Supine to Sit     Supine to sit: Supervision(use of bedrail)        Transfers Overall transfer level: Needs assistance Equipment used: None Transfers: Sit to/from [employer] Sit to Stand: Min guard;Min assist Stand pivot transfers: Min assist;Min guard       General transfer comment: stand-step transfer EOB>chair, sit<>stand from chair  Ambulation/Gait    General Gait Details: deferred due to SOB and desats with transfers    Balance Overall balance  assessment: Needs assistance Sitting-balance support: Feet supported Sitting balance-Leahy Scale: Good     Standing balance support: No upper extremity supported;Single extremity supported Standing balance-Leahy Scale: Fair Standing balance comment: tremulous (due to meds?)      Pertinent Vitals/Pain Pain Assessment: No/denies pain    Home Living Family/patient expects to be discharged to:: Private residence Living Arrangements: Spouse/significant other Available Help at Discharge: Family Type of Home: House Home Access: Stairs to enter Entrance Stairs-Rails: Can reach both Entrance Stairs-Number of Steps: 5 Home Layout: One level Home Equipment: None Additional Comments: Patient's husband had COVID but has recovered.    Prior Function Level of Independence: Independent    Comments: Pt independent for all ADLs, IADLs, mobility and driving. Pt was working in optometrist office, fiitting glasses     Hand Dominance   Dominant Hand: Right    Extremity/Trunk Assessment     Lower Extremity Assessment Lower Extremity Assessment: Generalized weakness       Communication   Communication: No difficulties  Cognition Arousal/Alertness: Awake/alert Behavior During Therapy: WFL for tasks assessed/performed Overall Cognitive Status: Within Functional Limits for tasks assessed     General Comments General comments (skin integrity, edema, etc.): Patient on 10L HFNC. SOB/increased RR upon entry (RR 26) but did decreased with simple questions/distraction. O2 saturation 89-90% at rest in bed. O2 saturation 86-87% sitting EOB, HR 99 bpm, RR 28. After transfer to chair, O2 sat 86%, HR 88 bpm. After sit<>stand from chair, oxygen saturation 80%-->increased to 86%, HR 90 bpm, RR 28 after 2 minutes seated reclined rest -->88% after 4 minutes seated reclined rest        Assessment/Plan  PT Assessment Patient needs continued PT services  PT Problem List Decreased strength;Decreased  activity tolerance;Decreased balance;Cardiopulmonary status limiting activity;Decreased mobility       PT Treatment Interventions DME instruction;Gait training;Stair training;Functional mobility training;Therapeutic activities;Therapeutic exercise;Balance training;Patient/family education    PT Goals (Current goals can be found in the Care Plan section)  Acute Rehab PT Goals Time For Goal Achievement: 01/15/20 Potential to Achieve Goals: Good    Frequency Min 3X/week   Barriers to discharge Other (comment)(unsure if husband is back to work yet) Patient reports husband contracted COVID from work and plant was shut down. He has recovered. Need to clarify if husband will be back to work and patient will be home alone upon discharge or will husband be home with patient at time of discharge.    Co-evaluation PT/OT/SLP Co-Evaluation/Treatment: Yes Reason for Co-Treatment: Other (comment)(decreased activity tolerance) PT goals addressed during session: Mobility/safety with mobility;Balance         AM-PAC PT "6 Clicks" Mobility  Outcome Measure Help needed turning from your back to your side while in a flat bed without using bedrails?: None Help needed moving from lying on your back to sitting on the side of a flat bed without using bedrails?: A Little Help needed moving to and from a bed to a chair (including a wheelchair)?: A Little Help needed standing up from a chair using your arms (e.g., wheelchair or bedside chair)?: A Little Help needed to walk in hospital room?: A Lot Help needed climbing 3-5 steps with a railing? : Total 6 Click Score: 16    End of Session Equipment Utilized During Treatment: Oxygen Activity Tolerance: Patient limited by fatigue;Other (comment)(limited due to SOB and desats with transfers) Patient left: in chair;with call bell/phone within reach;with chair alarm set Nurse Communication: Mobility status;Other (comment)(oxygen saturation response) PT Visit  Diagnosis: Unsteadiness on feet (R26.81);Other abnormalities of gait and mobility (R26.89)    Time: 8127-5170 PT Time Calculation (min) (ACUTE ONLY): 52 min   Charges:   PT Evaluation $PT Eval Moderate Complexity: 1 Mod          Birdie Hopes, PT, DPT Acute Rehab [phone] office    Birdie Hopes 01/02/2020, 10:36 AM

## 2020-01-02 NOTE — Progress Notes (Signed)
Patient continues on HFNC at 10L/min.  O2 sats within normal limit per order, vital signs stable. NO acute distress noted.

## 2020-01-03 LAB — CBC WITH DIFFERENTIAL/PLATELET
Abs Immature Granulocytes: 0.08 10*3/uL — ABNORMAL HIGH (ref 0.00–0.07)
Basophils Absolute: 0 10*3/uL (ref 0.0–0.1)
Basophils Relative: 0 %
Eosinophils Absolute: 0 10*3/uL (ref 0.0–0.5)
Eosinophils Relative: 0 %
HCT: 31.2 % — ABNORMAL LOW (ref 36.0–46.0)
Hemoglobin: 10.2 g/dL — ABNORMAL LOW (ref 12.0–15.0)
Immature Granulocytes: 1 %
Lymphocytes Relative: 9 %
Lymphs Abs: 0.8 10*3/uL (ref 0.7–4.0)
MCH: 31.8 pg (ref 26.0–34.0)
MCHC: 32.7 g/dL (ref 30.0–36.0)
MCV: 97.2 fL (ref 80.0–100.0)
Monocytes Absolute: 0.5 10*3/uL (ref 0.1–1.0)
Monocytes Relative: 5 %
Neutro Abs: 7.3 10*3/uL (ref 1.7–7.7)
Neutrophils Relative %: 85 %
Platelets: 397 10*3/uL (ref 150–400)
RBC: 3.21 MIL/uL — ABNORMAL LOW (ref 3.87–5.11)
RDW: 12.6 % (ref 11.5–15.5)
WBC: 8.6 10*3/uL (ref 4.0–10.5)
nRBC: 0 % (ref 0.0–0.2)

## 2020-01-03 LAB — COMPREHENSIVE METABOLIC PANEL
ALT: 22 U/L (ref 0–44)
AST: 19 U/L (ref 15–41)
Albumin: 2.6 g/dL — ABNORMAL LOW (ref 3.5–5.0)
Alkaline Phosphatase: 37 U/L — ABNORMAL LOW (ref 38–126)
Anion gap: 10 (ref 5–15)
BUN: 27 mg/dL — ABNORMAL HIGH (ref 6–20)
CO2: 21 mmol/L — ABNORMAL LOW (ref 22–32)
Calcium: 8.1 mg/dL — ABNORMAL LOW (ref 8.9–10.3)
Chloride: 107 mmol/L (ref 98–111)
Creatinine, Ser: 0.54 mg/dL (ref 0.44–1.00)
GFR calc Af Amer: >60 mL/min (ref >60)
GFR calc non Af Amer: >60 mL/min (ref >60)
Glucose, Bld: 197 mg/dL — ABNORMAL HIGH (ref 70–99)
Potassium: 4 mmol/L (ref 3.5–5.1)
Sodium: 138 mmol/L (ref 135–145)
Total Bilirubin: 0.4 mg/dL (ref 0.3–1.2)
Total Protein: 5.7 g/dL — ABNORMAL LOW (ref 6.5–8.1)

## 2020-01-03 LAB — GLUCOSE, CAPILLARY: Glucose-Capillary: 173 mg/dL — ABNORMAL HIGH (ref 70–99)

## 2020-01-03 LAB — D-DIMER, QUANTITATIVE: D-Dimer, Quant: 0.33 ug/mL-FEU (ref 0.00–0.50)

## 2020-01-03 LAB — C-REACTIVE PROTEIN: CRP: 1.4 mg/dL — ABNORMAL HIGH (ref <1.0)

## 2020-01-03 NOTE — Plan of Care (Signed)
  Problem: Education: Goal: Knowledge of General Education information will improve Description: Including pain rating scale, medication(s)/side effects and non-pharmacologic comfort measures Outcome: Progressing   Problem: Clinical Measurements: Goal: Ability to maintain clinical measurements within normal limits will improve Outcome: Progressing Goal: Will remain free from infection Outcome: Progressing Goal: Diagnostic test results will improve Outcome: Progressing   Problem: Respiratory: Goal: Will maintain a patent airway Outcome: Progressing Goal: Complications related to the disease process, condition or treatment will be avoided or minimized Outcome: Progressing

## 2020-01-03 NOTE — Progress Notes (Addendum)
PROGRESS NOTE                                                                                                                                                                                                             Patient Demographics:    [name], is a 54 y.o. female, DOB - March 05, 1966, ION:[id number]  Outpatient Primary MD for the patient is Marcine Matar, MD   Admit date - 12/30/2019   LOS - 4  Chief Complaint  Patient presents with  . Pneumonia       Brief Narrative: Patient is a 54 y.o. female with PMHx of asthma, HTN-who was diagnosed with COVID-19 on 4/16 [facility])-presented with worsening shortness of breath-found to have acute hypoxic respiratory failure secondary to COVID-19 pneumonia.  Significant Events: 4/16>> COVID-19 positive at [facility] (see picture of result in ED note on 4/21)  4/26 >> admit to [facility]  4/28>>Worsening hypoxemia on NRB  COVID-19 medications: Steroids: 4/26>> Remdesivir: 4/26 Actemra: 4/28 x 1  Antibiotics: None  Microbiology data: 4/26>> blood cultures: 1/2 gram-positive cocci 4/26>> blood cultures: No growth 4/28>> blood cultures: No growth  DVT prophylaxis: SQ Lovenox  Procedures: None  Consults: None    Subjective:   Feels better-but still remains on around 10 L of oxygen this morning.  She appears comfortable at rest-but does get short of breath with minimal ambulation.  However she does appear to be more comfortable than yesterday.    Assessment  & Plan :   Acute Hypoxic Resp Failure due to Covid 19 Viral pneumonia: Feels better-still on 10 L of HFNC this morning.  CRP  almost normalized.  Continue supportive care-remains on steroids and remdesivir.  No signs of volume overload.  Slightly anxious-have started as needed Klonopin for anxiety.  Fever: afebrile  O2 requirements:  SpO2: 98 % O2 Flow Rate (L/min): (S) 6  L/min   COVID-19 Labs: Recent Labs    01/01/20 0417 01/02/20 0522 01/03/20 0510  DDIMER 0.50 0.42 0.33  CRP 6.3* 2.8* 1.4*       Component Value Date/Time   BNP 68.9 01/02/2020 0522    Recent Labs  Lab 12/30/19 1447  PROCALCITON <0.10    No results found for: SARSCOV2NAA   Prone/Incentive Spirometry: encouraged patient to lie prone for 3-4 hours at a time for  a total of 16 hours a day, and to encourage incentive spirometry use 3-4/hour.  Coag negative staph bacteremia: Likely contaminant-await final culture results-however and repeat blood cultures on 4/28 negative so far.  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   Weight as of this encounter: 77.6 kg.    ABG: No results found for: PHART, PCO2ART, PO2ART, HCO3, TCO2, ACIDBASEDEF, O2SAT  Vent Settings: N/A  Condition - Extremely Guarded  Family Communication  : Left voicemail for spouse on 4/30  Code Status :  Full Code  Diet :  Diet Order            Diet regular Room service appropriate? Yes; Fluid consistency: Thin  Diet effective now               Disposition Plan  :   Status is: Inpatient  Remains inpatient appropriate because:Inpatient level of care appropriate due to severity of illness  Dispo: The patient is from: Home              Anticipated d/c is to: Home              Anticipated d/c date is: > 3 days              Patient currently is not medically stable to d/c.   Barriers to discharge: Hypoxia requiring O2 supplementation/complete 5 days of IV Remdesivir  Antimicorbials  :    Anti-infectives (From admission, onward)   Start     Dose/Rate Route Frequency Ordered Stop   12/31/19 1000  remdesivir 100 mg in sodium chloride 0.9 % 100 mL IVPB     100 mg 200 mL/hr over 30  Minutes Intravenous Daily 12/30/19 1713 01/03/20 0809   12/30/19 1800  remdesivir 200 mg in sodium chloride 0.9% 250 mL IVPB     200 mg 580 mL/hr over 30 Minutes Intravenous Once 12/30/19 1713 12/30/19 2029      Inpatient Medications  Scheduled Meds: . albuterol  2 puff Inhalation Q6H  . benzonatate  200 mg Oral TID  . budesonide  2 puff Inhalation BID  . dexamethasone (DECADRON) injection  10 mg Intravenous Daily  . enoxaparin (LOVENOX) injection  40 mg Subcutaneous Q24H  . fluticasone  2 spray Each Nare Daily   Continuous Infusions:  PRN Meds:.acetaminophen, albuterol, chlorpheniramine-HYDROcodone, clonazePAM, guaiFENesin-dextromethorphan, ondansetron **OR** ondansetron (ZOFRAN) IV, oxymetazoline   Time Spent in minutes  35  See all Orders from today for further details   Jeoffrey Massed M.D on 01/03/2020 at 1:25 PM  To page go to [email or web address] - use universal password  [facility] -  Office  [phone]    Objective:   Vitals:   01/03/20 0036 01/03/20 0619 01/03/20 1000 01/03/20 1025  BP: 120/74 129/78  (!) 145/78  Pulse: 73 76  89  Resp: 15 19  (!) 22  Temp: 97.8 F (36.6 C) 98 F (36.7 C)  98 F (36.7 C)  TempSrc: Oral Oral  Oral  SpO2: 90% 91% 100% 98%  Weight:      Height:        Wt Readings from Last 3 Encounters:  12/30/19 77.6 kg  12/25/19 77.6 kg  04/06/17 86.6 kg    No intake or output data in the 24 hours ending 01/03/20 1325   Physical Exam  Gen Exam:Alert awake-not in any distress HEENT:atraumatic, normocephalic Chest: B/L clear to auscultation anteriorly CVS:S1S2 regular Abdomen:soft non tender, non distended Extremities:no edema Neurology: Non focal Skin: no rash   Data Review:    CBC Recent Labs  Lab 12/30/19 1447 12/31/19 0511 01/01/20 0417 01/02/20 0522 01/03/20 0510  WBC 7.2 4.1 6.7 10.1 8.6  HGB 11.7* 11.3* 11.0* 10.2* 10.2*  HCT 35.0* 34.2* 32.2* 29.9* 31.2*  PLT 276 284 336 378 397  MCV 91.4 92.2 90.7  92.0 97.2  MCH 30.5 30.5 31.0 31.4 31.8  MCHC 33.4 33.0 34.2 34.1 32.7  RDW 12.3 12.5 12.4 12.6 12.6  LYMPHSABS 1.0 0.6* 0.8 0.8 0.8  MONOABS 0.4 0.1 0.4 0.5 0.5  EOSABS 0.0 0.0 0.0 0.0 0.0  BASOSABS 0.0 0.0 0.0 0.0 0.0    Chemistries  Recent Labs  Lab 12/30/19 1447 12/31/19 0511 01/01/20 0417 01/02/20 0522 01/03/20 0510  NA 136 138 140 138 138  K 3.8 3.8 3.7 3.7 4.0  CL 101 104 109 107 107  CO2 21* 21* 13* 23 21*  GLUCOSE 97 139* 156* 203* 197*  BUN 19 20 30* 31* 27*  CREATININE 0.84 0.63 0.65 0.63 0.54  CALCIUM 8.1* 8.2* 8.4* 8.2* 8.1*  MG  --  2.4  --   --   --   AST 29 28 24 20 19   ALT 19 20 20 20 22   ALKPHOS 50 46 42 40 37*  BILITOT 0.8 0.8 0.7 0.4 0.4   ------------------------------------------------------------------------------------------------------------------ No results for input(s): CHOL, HDL, LDLCALC, TRIG, CHOLHDL, LDLDIRECT in the last 72 hours.  Lab Results  Component Value Date   HGBA1C  08/20/2009    5.5 (NOTE) The ADA recommends the following therapeutic goal for glycemic control related to Hgb A1c measurement: Goal of therapy: <6.5 Hgb A1c  Reference: American Diabetes Association: Clinical Practice Recommendations 2010, Diabetes Care, 2010, 33: (Suppl  1).   ------------------------------------------------------------------------------------------------------------------ No results for input(s): TSH, T4TOTAL, T3FREE, THYROIDAB in the last 72 hours.  Invalid input(s): FREET3 ------------------------------------------------------------------------------------------------------------------ No results for input(s): VITAMINB12, FOLATE, FERRITIN, TIBC, IRON, RETICCTPCT in the last 72 hours.  Coagulation profile No results for input(s): INR, PROTIME in the last 168 hours.  Recent Labs    01/02/20 0522 01/03/20 0510  DDIMER 0.42 0.33    Cardiac Enzymes No results for input(s): CKMB, TROPONINI, MYOGLOBIN in the last 168 hours.  Invalid  input(s): CK ------------------------------------------------------------------------------------------------------------------    Component Value Date/Time   BNP 68.9 01/02/2020 0522    Micro Results Recent Results (from the past 240 hour(s))  Blood Culture (routine x 2)     Status: None (Preliminary result)   Collection Time: 12/30/19  2:47 PM   Specimen: BLOOD  Result Value Ref Range Status   Specimen Description BLOOD RIGHT ANTECUBITAL  Final   Special Requests   Final    BOTTLES DRAWN AEROBIC AND ANAEROBIC Blood Culture adequate volume   Culture   Final    NO GROWTH 3 DAYS Performed at [facility], [address] [place], [place] [address]    Report Status PENDING  Incomplete  Blood Culture (routine x 2)     Status: Abnormal   Collection Time: 12/30/19  2:47 PM   Specimen: BLOOD  Result Value Ref Range Status   Specimen Description BLOOD LEFT ANTECUBITAL  Final   Special Requests   Final    BOTTLES DRAWN AEROBIC AND ANAEROBIC Blood Culture adequate volume   Culture  Setup Time   Final  GRAM POSITIVE COCCI IN CLUSTERS AEROBIC BOTTLE ONLY Organism ID to follow CRITICAL RESULT CALLED TO, READ BACK BY AND VERIFIED WITH: Criss Rosales PharmD 13:20 12/31/19 ([email or web address])    Culture (A)  Final    STAPHYLOCOCCUS SPECIES (COAGULASE NEGATIVE) THE SIGNIFICANCE OF ISOLATING THIS ORGANISM FROM A SINGLE SET OF BLOOD CULTURES WHEN MULTIPLE SETS ARE DRAWN IS UNCERTAIN. PLEASE NOTIFY THE MICROBIOLOGY DEPARTMENT WITHIN ONE WEEK IF SPECIATION AND SENSITIVITIES ARE REQUIRED. Performed at [facility], [place] [address], [place], [place] [address]    Report Status 01/02/2020 FINAL  Final  Blood Culture ID Panel (Reflexed)     Status: Abnormal   Collection Time: 12/30/19  2:47 PM  Result Value Ref Range Status   Enterococcus species NOT DETECTED NOT DETECTED Final   Listeria monocytogenes NOT DETECTED NOT DETECTED Final   Staphylococcus species DETECTED (A) NOT DETECTED Final     Comment: Methicillin (oxacillin) susceptible coagulase negative staphylococcus. Possible blood culture contaminant (unless isolated from more than one blood culture draw or clinical case suggests pathogenicity). No antibiotic treatment is indicated for blood  culture contaminants. CRITICAL RESULT CALLED TO, READ BACK BY AND VERIFIED WITH: Criss Rosales PharmD 13:20 12/31/19 ([email or web address])    Staphylococcus aureus (BCID) NOT DETECTED NOT DETECTED Final   Methicillin resistance NOT DETECTED NOT DETECTED Final   Streptococcus species NOT DETECTED NOT DETECTED Final   Streptococcus agalactiae NOT DETECTED NOT DETECTED Final   Streptococcus pneumoniae NOT DETECTED NOT DETECTED Final   Streptococcus pyogenes NOT DETECTED NOT DETECTED Final   Acinetobacter baumannii NOT DETECTED NOT DETECTED Final   Enterobacteriaceae species NOT DETECTED NOT DETECTED Final   Enterobacter cloacae complex NOT DETECTED NOT DETECTED Final   Escherichia coli NOT DETECTED NOT DETECTED Final   Klebsiella oxytoca NOT DETECTED NOT DETECTED Final   Klebsiella pneumoniae NOT DETECTED NOT DETECTED Final   Proteus species NOT DETECTED NOT DETECTED Final   Serratia marcescens NOT DETECTED NOT DETECTED Final   Haemophilus influenzae NOT DETECTED NOT DETECTED Final   Neisseria meningitidis NOT DETECTED NOT DETECTED Final   Pseudomonas aeruginosa NOT DETECTED NOT DETECTED Final   Candida albicans NOT DETECTED NOT DETECTED Final   Candida glabrata NOT DETECTED NOT DETECTED Final   Candida krusei NOT DETECTED NOT DETECTED Final   Candida parapsilosis NOT DETECTED NOT DETECTED Final   Candida tropicalis NOT DETECTED NOT DETECTED Final    Comment: Performed at [facility], [address], [place], [place] [address]  Culture, blood (Routine X 2) w Reflex to ID Panel     Status: None (Preliminary result)   Collection Time: 01/01/20  6:24 PM   Specimen: BLOOD RIGHT HAND  Result Value Ref Range Status   Specimen Description BLOOD RIGHT  HAND  Final   Special Requests   Final    BOTTLES DRAWN AEROBIC AND ANAEROBIC Blood Culture adequate volume   Culture   Final    NO GROWTH < 12 HOURS Performed at [facility], [place] [address] [place], [place] [address]    Report Status PENDING  Incomplete  Culture, blood (Routine X 2) w Reflex to ID Panel     Status: None (Preliminary result)   Collection Time: 01/01/20  6:31 PM   Specimen: BLOOD LEFT HAND  Result Value Ref Range Status   Specimen Description BLOOD LEFT HAND  Final   Special Requests   Final    BOTTLES DRAWN AEROBIC AND ANAEROBIC Blood Culture adequate volume   Culture   Final  NO GROWTH < 12 HOURS Performed at [facility], [address] [place], [place] [address]    Report Status PENDING  Incomplete    Radiology Reports CT CHEST WO CONTRAST  Result Date: 12/30/2019 CLINICAL DATA:  Unresolved pneumonia. Diagnosed with COVID 415. Worsening respiratory symptoms. Hypoxia. EXAM: CT CHEST WITHOUT CONTRAST TECHNIQUE: Multidetector CT imaging of the chest was performed following the standard protocol without IV contrast. COMPARISON:  Radiograph earlier this day, also 12/25/2019. FINDINGS: Cardiovascular: Thoracic aorta is normal in caliber. Minor aortic atherosclerosis. Heart is normal in size. No pericardial effusion. Mediastinum/Nodes: Limited assessment for adenopathy given low lung volumes, patient motion, and lack of contrast. Scattered prominent mediastinal lymph nodes. Cannot assess for hilar adenopathy given lack of IV contrast. Distal esophagus slightly patulous. No visualized thyroid nodule. Lungs/Pleura: Low lung volumes with bibasilar dependent atelectasis/collapse. Heterogeneous bilateral peripheral opacities in the mid lower lung zone predominant distribution. No pleural fluid. No evidence of pulmonary edema. Trachea and mainstem bronchi are patent. Upper Abdomen: Mild bilateral perinephric edema is nonspecific. Musculoskeletal: There are no acute or  suspicious osseous abnormalities. IMPRESSION: Low lung volumes with bibasilar dependent atelectasis/partial collapse. Heterogeneous bilateral peripheral opacities in the mid lower lung zone predominant distribution, consistent with COVID-19 pneumonia. Aortic Atherosclerosis (ICD10-I70.0). Electronically Signed   By: Narda Rutherford M.D.   On: 12/30/2019 19:01   DG CHEST PORT 1 VIEW  Result Date: 01/01/2020 CLINICAL DATA:  Increased fatigue.  COVID-19 EXAM: PORTABLE CHEST 1 VIEW COMPARISON:  Two days ago FINDINGS: Low volume chest with patchy pulmonary infiltrate asymmetric to the subpleural left lung. Normal heart size for low volume inflation. No visible effusion or pneumothorax. IMPRESSION: Low volume chest with atypical pneumonia, slightly increased on the left when compared to 2 days ago. Electronically Signed   By: Marnee Spring M.D.   On: 01/01/2020 05:27   DG Chest Port 1 View  Result Date: 12/30/2019 CLINICAL DATA:  COVID, hypoxia. EXAM: PORTABLE CHEST 1 VIEW COMPARISON:  Prior chest radiographs 12/25/2019 and earlier FINDINGS: Heart size within normal limits. Aortic atherosclerosis. Ill-defined airspace disease within the left mid to lower lung field has somewhat progressed. New from prior exam, there is ill-defined airspace disease within the right lung base. Small amount of fluid now tracking within the minor fissure. No definite pleural effusion. No evidence of pneumothorax. No acute bony abnormality. IMPRESSION: Ill-defined airspace disease within the left mid to lower lung, progressed. New airspace disease within the right lung base. Findings are consistent with multifocal pneumonia. Electronically Signed   By: Jackey Loge DO   On: 12/30/2019 14:37   DG Chest Port 1 View  Result Date: 12/25/2019 CLINICAL DATA:  Cough. COVID positive. EXAM: PORTABLE CHEST 1 VIEW COMPARISON:  Two-view chest x-ray 03/05/2017 FINDINGS: The heart size is normal. New left lower lobe airspace disease is  present. Upper lobes are clear bilaterally. Right lung is clear. IMPRESSION: New left lower lobe airspace disease compatible with pneumonia. Electronically Signed   By: Marin Roberts M.D.   On: 12/25/2019 06:32   DG Chest Port 1V same Day  Result Date: 01/02/2020 CLINICAL DATA:  Shortness of breath, history hypertension, former smoker, COVID-19 pneumonia EXAM: PORTABLE CHEST 1 VIEW COMPARISON:  Portable exam 1040 hours compared to 01/01/2020 FINDINGS: Normal heart size, mediastinal contours, and pulmonary vascularity. Bibasilar atelectasis with patchy infiltrates in the periphery of the mid to lower lungs bilaterally. Upper lungs clear. No pleural effusion or pneumothorax. IMPRESSION: Persistent peripheral infiltrates in the mid to lower  lungs consistent with COVID-19 pneumonia. Bibasilar atelectasis. Electronically Signed   By: Ulyses Southward M.D.   On: 01/02/2020 10:48

## 2020-01-04 LAB — CBC WITH DIFFERENTIAL/PLATELET
Abs Immature Granulocytes: 0.13 10*3/uL — ABNORMAL HIGH (ref 0.00–0.07)
Basophils Absolute: 0 10*3/uL (ref 0.0–0.1)
Basophils Relative: 0 %
Eosinophils Absolute: 0 10*3/uL (ref 0.0–0.5)
Eosinophils Relative: 0 %
HCT: 31.6 % — ABNORMAL LOW (ref 36.0–46.0)
Hemoglobin: 10.6 g/dL — ABNORMAL LOW (ref 12.0–15.0)
Immature Granulocytes: 1 %
Lymphocytes Relative: 14 %
Lymphs Abs: 1.3 10*3/uL (ref 0.7–4.0)
MCH: 31 pg (ref 26.0–34.0)
MCHC: 33.5 g/dL (ref 30.0–36.0)
MCV: 92.4 fL (ref 80.0–100.0)
Monocytes Absolute: 0.8 10*3/uL (ref 0.1–1.0)
Monocytes Relative: 8 %
Neutro Abs: 7.5 10*3/uL (ref 1.7–7.7)
Neutrophils Relative %: 77 %
Platelets: 451 10*3/uL — ABNORMAL HIGH (ref 150–400)
RBC: 3.42 MIL/uL — ABNORMAL LOW (ref 3.87–5.11)
RDW: 12.4 % (ref 11.5–15.5)
WBC: 9.7 10*3/uL (ref 4.0–10.5)
nRBC: 0 % (ref 0.0–0.2)

## 2020-01-04 LAB — COMPREHENSIVE METABOLIC PANEL
ALT: 21 U/L (ref 0–44)
AST: 16 U/L (ref 15–41)
Albumin: 2.6 g/dL — ABNORMAL LOW (ref 3.5–5.0)
Alkaline Phosphatase: 41 U/L (ref 38–126)
Anion gap: 8 (ref 5–15)
BUN: 20 mg/dL (ref 6–20)
CO2: 24 mmol/L (ref 22–32)
Calcium: 8.4 mg/dL — ABNORMAL LOW (ref 8.9–10.3)
Chloride: 106 mmol/L (ref 98–111)
Creatinine, Ser: 0.57 mg/dL (ref 0.44–1.00)
GFR calc Af Amer: >60 mL/min (ref >60)
GFR calc non Af Amer: >60 mL/min (ref >60)
Glucose, Bld: 132 mg/dL — ABNORMAL HIGH (ref 70–99)
Potassium: 4.2 mmol/L (ref 3.5–5.1)
Sodium: 138 mmol/L (ref 135–145)
Total Bilirubin: 0.5 mg/dL (ref 0.3–1.2)
Total Protein: 5.7 g/dL — ABNORMAL LOW (ref 6.5–8.1)

## 2020-01-04 LAB — CULTURE, BLOOD (ROUTINE X 2)
Culture: NO GROWTH
Special Requests: ADEQUATE

## 2020-01-04 LAB — C-REACTIVE PROTEIN: CRP: 0.9 mg/dL (ref <1.0)

## 2020-01-04 LAB — D-DIMER, QUANTITATIVE: D-Dimer, Quant: 0.66 ug/mL-FEU — ABNORMAL HIGH (ref 0.00–0.50)

## 2020-01-04 MED ORDER — DEXAMETHASONE SODIUM PHOSPHATE 10 MG/ML IJ SOLN
6.0000 mg | Freq: Every day | INTRAMUSCULAR | Status: DC
  Administered 2020-01-05: 09:00:00 6 mg via INTRAVENOUS
  Filled 2020-01-04: qty 1

## 2020-01-04 NOTE — Progress Notes (Signed)
PROGRESS NOTE                                                                                                                                                                                                             Patient Demographics:    [name], is a 54 y.o. female, DOB - Jan 31, 1966, ZOX:[id number]  Outpatient Primary MD for the patient is Marcine Matar, MD   Admit date - 12/30/2019   LOS - 5  Chief Complaint  Patient presents with  . Pneumonia       Brief Narrative: Patient is a 54 y.o. female with PMHx of asthma, HTN-who was diagnosed with COVID-19 on 4/16 [facility])-presented with worsening shortness of breath-found to have acute hypoxic respiratory failure secondary to COVID-19 pneumonia.  Significant Events: 4/16>> COVID-19 positive at [facility] (see picture of result in ED note on 4/21)  4/26 >> admit to [facility]  4/28>>Worsening hypoxemia on NRB  COVID-19 medications: Steroids: 4/26>> Remdesivir: 4/26>>4/30 Actemra: 4/28 x 1  Antibiotics: None  Microbiology data: 4/26>> blood cultures: 1/2 gram-positive cocci 4/26>> blood cultures: No growth 4/28>> blood cultures: No growth  DVT prophylaxis: SQ Lovenox  Procedures: None  Consults: None    Subjective:   Appears much more comfortable-acknowledges significant less shortness of breath-Down to 1 L of oxygen this morning.   Assessment  & Plan :   Acute Hypoxic Resp Failure due to Covid 19 Viral pneumonia: Rapidly improved-Down to 1 L of oxygen-CRP has normalized.  Some amount of anxiety that is managed with Klonopin.  Remains on steroids-has finished a course of remdesivir.  If clinical improvement continues-suspect home in the next day or so.  Note-need to assess for home O2 requirement prior to discharge.  Fever: afebrile  O2 requirements:  SpO2: 98 % O2 Flow Rate (L/min): 2 L/min   COVID-19  Labs: Recent Labs    01/02/20 0522 01/03/20 0510 01/04/20 0719  DDIMER 0.42 0.33 0.66*  CRP 2.8* 1.4* 0.9       Component Value Date/Time   BNP 68.9 01/02/2020 0522    Recent Labs  Lab 12/30/19 1447  PROCALCITON <0.10    No results found for: SARSCOV2NAA   Prone/Incentive Spirometry: encouraged patient to lie prone for 3-4 hours at a time for a total of 16 hours a day, and to  encourage incentive spirometry use 3-4/hour.  Coag negative staph bacteremia: Likely contaminant-await final culture results-however and repeat blood cultures on 4/28 negative so far.  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   Weight as of this encounter: 77.6 kg.    ABG: No results found for: PHART, PCO2ART, PO2ART, HCO3, TCO2, ACIDBASEDEF, O2SAT  Vent Settings: N/A  Condition - Guarded  Family Communication  : Spoke with spouse on 5/1  Code Status :  Full Code  Diet :  Diet Order            Diet regular Room service appropriate? Yes; Fluid consistency: Thin  Diet effective now               Disposition Plan  :   Status is: Inpatient  Remains inpatient appropriate because:Inpatient level of care appropriate due to severity of illness  Dispo: The patient is from: Home              Anticipated d/c is to: Home              Anticipated d/c date is: > 3 days              Patient currently is not medically stable to d/c.   Barriers to discharge: Hypoxia requiring O2 supplementation  Antimicorbials  :    Anti-infectives (From admission, onward)   Start     Dose/Rate Route Frequency Ordered Stop   12/31/19 1000  remdesivir 100 mg in sodium chloride 0.9 % 100 mL IVPB     100 mg 200 mL/hr over 30 Minutes Intravenous Daily 12/30/19 1713 01/03/20 0809   12/30/19 1800   remdesivir 200 mg in sodium chloride 0.9% 250 mL IVPB     200 mg 580 mL/hr over 30 Minutes Intravenous Once 12/30/19 1713 12/30/19 2029      Inpatient Medications  Scheduled Meds: . albuterol  2 puff Inhalation Q6H  . benzonatate  200 mg Oral TID  . budesonide  2 puff Inhalation BID  . dexamethasone (DECADRON) injection  10 mg Intravenous Daily  . enoxaparin (LOVENOX) injection  40 mg Subcutaneous Q24H  . fluticasone  2 spray Each Nare Daily   Continuous Infusions:  PRN Meds:.acetaminophen, albuterol, chlorpheniramine-HYDROcodone, clonazePAM, guaiFENesin-dextromethorphan, ondansetron **OR** ondansetron (ZOFRAN) IV, oxymetazoline   Time Spent in minutes  25  See all Orders from today for further details   Jeoffrey MassedShanker Loyd Salvador M.D on 01/04/2020 at 11:57 AM  To page go to [email or web address] - use universal password  [facility] -  Office  [phone]    Objective:   Vitals:   01/03/20 1355 01/03/20 1500 01/03/20 2100 01/04/20 0507  BP: 121/84  130/83 131/86  Pulse: 82  80 73  Resp: 15  (!) 23 18  Temp: 98.5 F (36.9 C)  98.6 F (37 C) 98.6 F (37 C)  TempSrc: Oral  Oral Oral  SpO2: 97% 94% 93% 98%  Weight:      Height:        Wt Readings from Last 3 Encounters:  12/30/19 77.6 kg  12/25/19 77.6 kg  04/06/17 86.6 kg     Intake/Output Summary (Last 24 hours) at 01/04/2020 1157 Last data filed at 01/04/2020 0900 Gross per 24 hour  Intake 360 ml  Output 500 ml  Net -140 ml  Physical Exam Gen Exam:Alert awake-not in any distress HEENT:atraumatic, normocephalic Chest: B/L clear to auscultation anteriorly CVS:S1S2 regular Abdomen:soft non tender, non distended Extremities:no edema Neurology: Non focal Skin: no rash   Data Review:    CBC Recent Labs  Lab 12/31/19 0511 01/01/20 0417 01/02/20 0522 01/03/20 0510 01/04/20 0719  WBC 4.1 6.7 10.1 8.6 9.7  HGB 11.3* 11.0* 10.2* 10.2* 10.6*  HCT 34.2* 32.2* 29.9* 31.2* 31.6*  PLT 284 336 378 397 451*    MCV 92.2 90.7 92.0 97.2 92.4  MCH 30.5 31.0 31.4 31.8 31.0  MCHC 33.0 34.2 34.1 32.7 33.5  RDW 12.5 12.4 12.6 12.6 12.4  LYMPHSABS 0.6* 0.8 0.8 0.8 1.3  MONOABS 0.1 0.4 0.5 0.5 0.8  EOSABS 0.0 0.0 0.0 0.0 0.0  BASOSABS 0.0 0.0 0.0 0.0 0.0    Chemistries  Recent Labs  Lab 12/31/19 0511 01/01/20 0417 01/02/20 0522 01/03/20 0510 01/04/20 0719  NA 138 140 138 138 138  K 3.8 3.7 3.7 4.0 4.2  CL 104 109 107 107 106  CO2 21* 13* 23 21* 24  GLUCOSE 139* 156* 203* 197* 132*  BUN 20 30* 31* 27* 20  CREATININE 0.63 0.65 0.63 0.54 0.57  CALCIUM 8.2* 8.4* 8.2* 8.1* 8.4*  MG 2.4  --   --   --   --   AST 28 24 20 19 16   ALT 20 20 20 22 21   ALKPHOS 46 42 40 37* 41  BILITOT 0.8 0.7 0.4 0.4 0.5   ------------------------------------------------------------------------------------------------------------------ No results for input(s): CHOL, HDL, LDLCALC, TRIG, CHOLHDL, LDLDIRECT in the last 72 hours.  Lab Results  Component Value Date   HGBA1C  08/20/2009    5.5 (NOTE) The ADA recommends the following therapeutic goal for glycemic control related to Hgb A1c measurement: Goal of therapy: <6.5 Hgb A1c  Reference: American Diabetes Association: Clinical Practice Recommendations 2010, Diabetes Care, 2010, 33: (Suppl  1).   ------------------------------------------------------------------------------------------------------------------ No results for input(s): TSH, T4TOTAL, T3FREE, THYROIDAB in the last 72 hours.  Invalid input(s): FREET3 ------------------------------------------------------------------------------------------------------------------ No results for input(s): VITAMINB12, FOLATE, FERRITIN, TIBC, IRON, RETICCTPCT in the last 72 hours.  Coagulation profile No results for input(s): INR, PROTIME in the last 168 hours.  Recent Labs    01/03/20 0510 01/04/20 0719  DDIMER 0.33 0.66*    Cardiac Enzymes No results for input(s): CKMB, TROPONINI, MYOGLOBIN in the last 168  hours.  Invalid input(s): CK ------------------------------------------------------------------------------------------------------------------    Component Value Date/Time   BNP 68.9 01/02/2020 0522    Micro Results Recent Results (from the past 240 hour(s))  Blood Culture (routine x 2)     Status: None   Collection Time: 12/30/19  2:47 PM   Specimen: BLOOD  Result Value Ref Range Status   Specimen Description BLOOD RIGHT ANTECUBITAL  Final   Special Requests   Final    BOTTLES DRAWN AEROBIC AND ANAEROBIC Blood Culture adequate volume   Culture   Final    NO GROWTH 5 DAYS Performed at [facility], [address], [place], [address] [place]    Report Status 01/04/2020 FINAL  Final  Blood Culture (routine x 2)     Status: Abnormal   Collection Time: 12/30/19  2:47 PM   Specimen: BLOOD  Result Value Ref Range Status   Specimen Description BLOOD LEFT ANTECUBITAL  Final   Special Requests   Final    BOTTLES DRAWN AEROBIC AND ANAEROBIC Blood Culture adequate volume   Culture  Setup Time   Final  GRAM POSITIVE COCCI IN CLUSTERS AEROBIC BOTTLE ONLY Organism ID to follow CRITICAL RESULT CALLED TO, READ BACK BY AND VERIFIED WITH: Danella Maiers PharmD 13:20 12/31/19 ([email or web address])    Culture (A)  Final    STAPHYLOCOCCUS SPECIES (COAGULASE NEGATIVE) THE SIGNIFICANCE OF ISOLATING THIS ORGANISM FROM A SINGLE SET OF BLOOD CULTURES WHEN MULTIPLE SETS ARE DRAWN IS UNCERTAIN. PLEASE NOTIFY THE MICROBIOLOGY DEPARTMENT WITHIN ONE WEEK IF SPECIATION AND SENSITIVITIES ARE REQUIRED. Performed at [facility], [address], [place], [place] [address]    Report Status 01/02/2020 FINAL  Final  Blood Culture ID Panel (Reflexed)     Status: Abnormal   Collection Time: 12/30/19  2:47 PM  Result Value Ref Range Status   Enterococcus species NOT DETECTED NOT DETECTED Final   Listeria monocytogenes NOT DETECTED NOT DETECTED Final   Staphylococcus species DETECTED (A) NOT DETECTED Final     Comment: Methicillin (oxacillin) susceptible coagulase negative staphylococcus. Possible blood culture contaminant (unless isolated from more than one blood culture draw or clinical case suggests pathogenicity). No antibiotic treatment is indicated for blood  culture contaminants. CRITICAL RESULT CALLED TO, READ BACK BY AND VERIFIED WITH: Danella Maiers PharmD 13:20 12/31/19 ([email or web address])    Staphylococcus aureus (BCID) NOT DETECTED NOT DETECTED Final   Methicillin resistance NOT DETECTED NOT DETECTED Final   Streptococcus species NOT DETECTED NOT DETECTED Final   Streptococcus agalactiae NOT DETECTED NOT DETECTED Final   Streptococcus pneumoniae NOT DETECTED NOT DETECTED Final   Streptococcus pyogenes NOT DETECTED NOT DETECTED Final   Acinetobacter baumannii NOT DETECTED NOT DETECTED Final   Enterobacteriaceae species NOT DETECTED NOT DETECTED Final   Enterobacter cloacae complex NOT DETECTED NOT DETECTED Final   Escherichia coli NOT DETECTED NOT DETECTED Final   Klebsiella oxytoca NOT DETECTED NOT DETECTED Final   Klebsiella pneumoniae NOT DETECTED NOT DETECTED Final   Proteus species NOT DETECTED NOT DETECTED Final   Serratia marcescens NOT DETECTED NOT DETECTED Final   Haemophilus influenzae NOT DETECTED NOT DETECTED Final   Neisseria meningitidis NOT DETECTED NOT DETECTED Final   Pseudomonas aeruginosa NOT DETECTED NOT DETECTED Final   Candida albicans NOT DETECTED NOT DETECTED Final   Candida glabrata NOT DETECTED NOT DETECTED Final   Candida krusei NOT DETECTED NOT DETECTED Final   Candida parapsilosis NOT DETECTED NOT DETECTED Final   Candida tropicalis NOT DETECTED NOT DETECTED Final    Comment: Performed at [facility], [address], [place], [place] [address]  Culture, blood (Routine X 2) w Reflex to ID Panel     Status: None (Preliminary result)   Collection Time: 01/01/20  6:24 PM   Specimen: BLOOD RIGHT HAND  Result Value Ref Range Status   Specimen Description BLOOD RIGHT  HAND  Final   Special Requests   Final    BOTTLES DRAWN AEROBIC AND ANAEROBIC Blood Culture adequate volume   Culture   Final    NO GROWTH 3 DAYS Performed at [facility], [address], [place], [place] [address]    Report Status PENDING  Incomplete  Culture, blood (Routine X 2) w Reflex to ID Panel     Status: None (Preliminary result)   Collection Time: 01/01/20  6:31 PM   Specimen: BLOOD LEFT HAND  Result Value Ref Range Status   Specimen Description BLOOD LEFT HAND  Final   Special Requests   Final    BOTTLES DRAWN AEROBIC AND ANAEROBIC Blood Culture adequate volume   Culture   Final  NO GROWTH 3 DAYS Performed at [facility], [address], [place], [place] [address]    Report Status PENDING  Incomplete    Radiology Reports CT CHEST WO CONTRAST  Result Date: 12/30/2019 CLINICAL DATA:  Unresolved pneumonia. Diagnosed with COVID 415. Worsening respiratory symptoms. Hypoxia. EXAM: CT CHEST WITHOUT CONTRAST TECHNIQUE: Multidetector CT imaging of the chest was performed following the standard protocol without IV contrast. COMPARISON:  Radiograph earlier this day, also 12/25/2019. FINDINGS: Cardiovascular: Thoracic aorta is normal in caliber. Minor aortic atherosclerosis. Heart is normal in size. No pericardial effusion. Mediastinum/Nodes: Limited assessment for adenopathy given low lung volumes, patient motion, and lack of contrast. Scattered prominent mediastinal lymph nodes. Cannot assess for hilar adenopathy given lack of IV contrast. Distal esophagus slightly patulous. No visualized thyroid nodule. Lungs/Pleura: Low lung volumes with bibasilar dependent atelectasis/collapse. Heterogeneous bilateral peripheral opacities in the mid lower lung zone predominant distribution. No pleural fluid. No evidence of pulmonary edema. Trachea and mainstem bronchi are patent. Upper Abdomen: Mild bilateral perinephric edema is nonspecific. Musculoskeletal: There are no acute or  suspicious osseous abnormalities. IMPRESSION: Low lung volumes with bibasilar dependent atelectasis/partial collapse. Heterogeneous bilateral peripheral opacities in the mid lower lung zone predominant distribution, consistent with COVID-19 pneumonia. Aortic Atherosclerosis (ICD10-I70.0). Electronically Signed   By: Narda Rutherford M.D.   On: 12/30/2019 19:01   DG CHEST PORT 1 VIEW  Result Date: 01/01/2020 CLINICAL DATA:  Increased fatigue.  COVID-19 EXAM: PORTABLE CHEST 1 VIEW COMPARISON:  Two days ago FINDINGS: Low volume chest with patchy pulmonary infiltrate asymmetric to the subpleural left lung. Normal heart size for low volume inflation. No visible effusion or pneumothorax. IMPRESSION: Low volume chest with atypical pneumonia, slightly increased on the left when compared to 2 days ago. Electronically Signed   By: Marnee Spring M.D.   On: 01/01/2020 05:27   DG Chest Port 1 View  Result Date: 12/30/2019 CLINICAL DATA:  COVID, hypoxia. EXAM: PORTABLE CHEST 1 VIEW COMPARISON:  Prior chest radiographs 12/25/2019 and earlier FINDINGS: Heart size within normal limits. Aortic atherosclerosis. Ill-defined airspace disease within the left mid to lower lung field has somewhat progressed. New from prior exam, there is ill-defined airspace disease within the right lung base. Small amount of fluid now tracking within the minor fissure. No definite pleural effusion. No evidence of pneumothorax. No acute bony abnormality. IMPRESSION: Ill-defined airspace disease within the left mid to lower lung, progressed. New airspace disease within the right lung base. Findings are consistent with multifocal pneumonia. Electronically Signed   By: Jackey Loge DO   On: 12/30/2019 14:37   DG Chest Port 1 View  Result Date: 12/25/2019 CLINICAL DATA:  Cough. COVID positive. EXAM: PORTABLE CHEST 1 VIEW COMPARISON:  Two-view chest x-ray 03/05/2017 FINDINGS: The heart size is normal. New left lower lobe airspace disease is  present. Upper lobes are clear bilaterally. Right lung is clear. IMPRESSION: New left lower lobe airspace disease compatible with pneumonia. Electronically Signed   By: Marin Roberts M.D.   On: 12/25/2019 06:32   DG Chest Port 1V same Day  Result Date: 01/02/2020 CLINICAL DATA:  Shortness of breath, history hypertension, former smoker, COVID-19 pneumonia EXAM: PORTABLE CHEST 1 VIEW COMPARISON:  Portable exam 1040 hours compared to 01/01/2020 FINDINGS: Normal heart size, mediastinal contours, and pulmonary vascularity. Bibasilar atelectasis with patchy infiltrates in the periphery of the mid to lower lungs bilaterally. Upper lungs clear. No pleural effusion or pneumothorax. IMPRESSION: Persistent peripheral infiltrates in the mid to lower lungs  consistent with COVID-19 pneumonia. Bibasilar atelectasis. Electronically Signed   By: Lavonia Dana M.D.   On: 01/02/2020 10:48

## 2020-01-05 MED ORDER — BENZONATATE 200 MG PO CAPS: 200.0000 mg | ORAL_CAPSULE | Freq: Three times a day (TID) | ORAL | 0 refills | Status: AC | PRN

## 2020-01-05 MED ORDER — CLONAZEPAM 0.25 MG PO TBDP: 0.2500 mg | ORAL_TABLET | Freq: Two times a day (BID) | ORAL | 0 refills | Status: AC | PRN

## 2020-01-05 MED ORDER — ALBUTEROL SULFATE HFA 108 (90 BASE) MCG/ACT IN AERS: 2.0000 | INHALATION_SPRAY | Freq: Four times a day (QID) | RESPIRATORY_TRACT | 0 refills | Status: DC

## 2020-01-05 MED ORDER — FLUTICASONE PROPIONATE 50 MCG/ACT NA SUSP
1.0000 | Freq: Every day | NASAL | 0 refills | Status: AC
Start: 2020-01-05 — End: 2021-01-04

## 2020-01-05 MED ORDER — DEXAMETHASONE 6 MG PO TABS
6.0000 mg | ORAL_TABLET | Freq: Every day | ORAL | 0 refills | Status: AC
Start: 2020-01-05 — End: ?

## 2020-01-05 MED ORDER — OXYMETAZOLINE HCL 0.05 % NA SOLN: 2.0000 | Freq: Two times a day (BID) | NASAL | 0 refills | Status: AC | PRN

## 2020-01-05 NOTE — TOC Transition Note (Signed)
Transition of Care [facility]) - CM/SW Discharge Note   Patient Details  Name: [name] MRN: [id number] Date of Birth: 06-06-1966  Transition [facility] Care [facility]) CM/SW Contact:  Lawerance Sabal, RN Phone Number: 01/05/2020, 11:21 AM   Clinical Narrative:   Sherron Monday w patient, discussed HH.  Agreeable to any provider. [facility] accepted and will call patient tonight.     Final next level of care: Home w Home Health Services Barriers to Discharge: No Barriers Identified   Patient Goals and CMS Choice        Discharge Placement                       Discharge Plan and Services                          HH Arranged: PT HH Agency: [facility] Date [facility] Agency Contacted: 01/05/20 Time HH Agency Contacted: [phone] Representative spoke with at [facility] Agency: Elnita Maxwell  Social Determinants of Health (SDOH) Interventions     Readmission Risk Interventions No flowsheet data found.

## 2020-01-05 NOTE — Discharge Instructions (Signed)
Person Under Monitoring Name: [name]  Location: [address] [place] [id number]   Infection Prevention Recommendations for Individuals Confirmed to have, or Being Evaluated for, 2019 Novel Coronavirus (COVID-19) Infection Who Receive Care at Home  Individuals who are confirmed to have, or are being evaluated for, COVID-19 should follow the prevention steps below until a healthcare provider or local or state health department says they can return to normal activities.  Stay home except to get medical care You should restrict activities outside your home, except for getting medical care. Do not go to work, school, or public areas, and do not use public transportation or taxis.  Call ahead before visiting your doctor Before your medical appointment, call the healthcare provider and tell them that you have, or are being evaluated for, COVID-19 infection. This will help the healthcare provider's office take steps to keep other people from getting infected. Ask your healthcare provider to call the local or state health department.  Monitor your symptoms Seek prompt medical attention if your illness is worsening (e.g., difficulty breathing). Before going to your medical appointment, call the healthcare provider and tell them that you have, or are being evaluated for, COVID-19 infection. Ask your healthcare provider to call the local or state health department.  Wear a facemask You should wear a facemask that covers your nose and mouth when you are in the same room with other people and when you visit a healthcare provider. People who live with or visit you should also wear a facemask while they are in the same room with you.  Separate yourself from other people in your home As much as possible, you should stay in a different room from other people in your home. Also, you should use a separate bathroom, if available.  Avoid sharing household items You  should not share dishes, drinking glasses, cups, eating utensils, towels, bedding, or other items with other people in your home. After using these items, you should wash them thoroughly with soap and water.  Cover your coughs and sneezes Cover your mouth and nose with a tissue when you cough or sneeze, or you can cough or sneeze into your sleeve. Throw used tissues in a lined trash can, and immediately wash your hands with soap and water for at least 20 seconds or use an alcohol-based hand rub.  Wash your [employer] your hands often and thoroughly with soap and water for at least 20 seconds. You can use an alcohol-based hand sanitizer if soap and water are not available and if your hands are not visibly dirty. Avoid touching your eyes, nose, and mouth with unwashed hands.   Prevention Steps for Caregivers and Household Members of Individuals Confirmed to have, or Being Evaluated for, COVID-19 Infection Being Cared for in the Home  If you live with, or provide care at home for, a person confirmed to have, or being evaluated for, COVID-19 infection please follow these guidelines to prevent infection:  Follow healthcare provider's instructions Make sure that you understand and can help the patient follow any healthcare provider instructions for all care.  Provide for the patient's basic needs You should help the patient with basic needs in the home and provide support for getting groceries, prescriptions, and other personal needs.  Monitor the patient's symptoms If they are getting sicker, call his or her medical provider and tell them that the patient has, or is being evaluated for, COVID-19 infection. This will help the healthcare provider's  office take steps to keep other people from getting infected. Ask the healthcare provider to call the local or state health department.  Limit the number of people who have contact with the patient  If possible, have only one caregiver for the  patient.  Other household members should stay in another home or place of residence. If this is not possible, they should stay  in another room, or be separated from the patient as much as possible. Use a separate bathroom, if available.  Restrict visitors who do not have an essential need to be in the home.  Keep older adults, very young children, and other sick people away from the patient Keep older adults, very young children, and those who have compromised immune systems or chronic health conditions away from the patient. This includes people with chronic heart, lung, or kidney conditions, diabetes, and cancer.  Ensure good ventilation Make sure that shared spaces in the home have good air flow, such as from an air conditioner or an opened window, weather permitting.  Wash your hands often  Wash your hands often and thoroughly with soap and water for at least 20 seconds. You can use an alcohol based hand sanitizer if soap and water are not available and if your hands are not visibly dirty.  Avoid touching your eyes, nose, and mouth with unwashed hands.  Use disposable paper towels to dry your hands. If not available, use dedicated cloth towels and replace them when they become wet.  Wear a facemask and gloves  Wear a disposable facemask at all times in the room and gloves when you touch or have contact with the patient's blood, body fluids, and/or secretions or excretions, such as sweat, saliva, sputum, nasal mucus, vomit, urine, or feces.  Ensure the mask fits over your nose and mouth tightly, and do not touch it during use.  Throw out disposable facemasks and gloves after using them. Do not reuse.  Wash your hands immediately after removing your facemask and gloves.  If your personal clothing becomes contaminated, carefully remove clothing and launder. Wash your hands after handling contaminated clothing.  Place all used disposable facemasks, gloves, and other waste in a lined  container before disposing them with other household waste.  Remove gloves and wash your hands immediately after handling these items.  Do not share dishes, glasses, or other household items with the patient  Avoid sharing household items. You should not share dishes, drinking glasses, cups, eating utensils, towels, bedding, or other items with a patient who is confirmed to have, or being evaluated for, COVID-19 infection.  After the person uses these items, you should wash them thoroughly with soap and water.  Wash laundry thoroughly  Immediately remove and wash clothes or bedding that have blood, body fluids, and/or secretions or excretions, such as sweat, saliva, sputum, nasal mucus, vomit, urine, or feces, on them.  Wear gloves when handling laundry from the patient.  Read and follow directions on labels of laundry or clothing items and detergent. In general, wash and dry with the warmest temperatures recommended on the label.  Clean all areas the individual has used often  Clean all touchable surfaces, such as counters, tabletops, doorknobs, bathroom fixtures, toilets, phones, keyboards, tablets, and bedside tables, every day. Also, clean any surfaces that may have blood, body fluids, and/or secretions or excretions on them.  Wear gloves when cleaning surfaces the patient has come in contact with.  Use a diluted bleach solution (e.g., dilute bleach with 1  part bleach and 10 parts water) or a household disinfectant with a label that says EPA-registered for coronaviruses. To make a bleach solution at home, add 1 tablespoon of bleach to 1 quart (4 cups) of water. For a larger supply, add  cup of bleach to 1 gallon (16 cups) of water.  Read labels of cleaning products and follow recommendations provided on product labels. Labels contain instructions for safe and effective use of the cleaning product including precautions you should take when applying the product, such as wearing gloves or  eye protection and making sure you have good ventilation during use of the product.  Remove gloves and wash hands immediately after cleaning.  Monitor yourself for signs and symptoms of illness Caregivers and household members are considered close contacts, should monitor their health, and will be asked to limit movement outside of the home to the extent possible. Follow the monitoring steps for close contacts listed on the symptom monitoring form.   ? If you have additional questions, contact your local health department or call the epidemiologist on call at [phone] (available 24/7). ? This guidance is subject to change. For the most up-to-date guidance from [facility], please refer to their website: [email or web address]

## 2020-01-05 NOTE — Progress Notes (Signed)
RN ambulated with patient on room air. During ambulation, o2 sats maintained 93-94%. Patient denied feeling lightheaded or dizzy.

## 2020-01-05 NOTE — Discharge Summary (Signed)
PATIENT DETAILS Name: [name] Age: 54 y.o. Sex: female Date of Birth: 11-20-1965 MRN: [id number]. Admitting Physician: Emeline General, MD WUJ:[id number], Binnie Rail, MD  Admit Date: 12/30/2019 Discharge date: 01/05/2020  Recommendations for Outpatient Follow-up:  1. Follow up with PCP in 1-2 weeks 2. Please obtain CMP/CBC in one week 3. Repeat Chest Xray in 4-6 week   Admitted From:  Home  Disposition: Home   Home Health: No  Equipment/Devices: None  Discharge Condition: Stable  CODE STATUS: FULL CODE  Diet recommendation:  Diet Order            Diet - low sodium heart healthy        Diet regular Room service appropriate? Yes; Fluid consistency: Thin  Diet effective now              Brief Narrative: Patient is a 54 y.o. female with PMHx of asthma, HTN-who was diagnosed with COVID-19 on 4/16 [facility])-presented with worsening shortness of breath-found to have acute hypoxic respiratory failure secondary to COVID-19 pneumonia.  Significant Events: 4/16>> COVID-19 positive at [facility] (see picture of result in ED note on 4/21)  4/26 >> admit to [facility]  4/28>>Worsening hypoxemia on NRB  COVID-19 medications: Steroids: 4/26>> Remdesivir: 4/26>>4/30 Actemra: 4/28 x 1  Antibiotics: None  Microbiology data: 4/26>> blood cultures: 1/2 gram-positive cocci 4/26>> blood cultures: No growth 4/28>> blood cultures: No growth  Procedures: None  Consults: None   Brief Hospital Course: Acute Hypoxic Resp Failure due to Covid 19 Viral pneumonia:  Did have severe disease-at one point was on 10-12 L of high flow oxygen-she was treated with steroids/remdesivir and Actemra.  She rapidly improved-she is now down to room air.  She was ambulated in the hallway today-and her oxygen saturation remained stable in the 90s without any oxygen supplementation.  She is stable to be discharged home-she will complete a few more days of  Decadron-she has completed a course of remdesivir.  She did have some anxiety component when she was dyspneic-and request that she be provided a few tablets of as needed Klonopin on discharge-which I have ordered.  COVID-19 Labs:  Recent Labs    01/03/20 0510 01/04/20 0719  DDIMER 0.33 0.66*  CRP 1.4* 0.9    No results found for: SARSCOV2NAA   Coag negative staph bacteremia: Likely contaminant-await final culture results-however and repeat blood cultures on 4/28 negative so far.  HTN: Blood pressure controlled-Per patient-she is no longer on antihypertensives at home.  Bronchial asthma: No wheezing-not in exacerbation-continue bronchodilators  Obesity: Estimated body mass index is 31.28 kg/m as calculated from the following:   Height as of this encounter: 5\' 2"  (1.575 m).   Weight as of this encounter: 77.6 kg.   Discharge Diagnoses:  Active Problems:   COVID-19 virus infection   COVID-19   Discharge Instructions:    Person Under Monitoring Name: [name]  Location: [address] [place] [address]   Infection Prevention Recommendations for Individuals Confirmed to have, or Being Evaluated for, 2019 Novel Coronavirus (COVID-19) Infection Who Receive Care at Home  Individuals who are confirmed to have, or are being evaluated for, COVID-19 should follow the prevention steps below until a healthcare provider or local or state health department says they can return to normal activities.  Stay home except to get medical care You should restrict activities outside your home, except for getting medical care. Do not go to work, school, or public areas, and  do not use public transportation or taxis.  Call ahead before visiting your doctor Before your medical appointment, call the healthcare provider and tell them that you have, or are being evaluated for, COVID-19 infection. This will help the healthcare provider's office take steps to keep  other people from getting infected. Ask your healthcare provider to call the local or state health department.  Monitor your symptoms Seek prompt medical attention if your illness is worsening (e.g., difficulty breathing). Before going to your medical appointment, call the healthcare provider and tell them that you have, or are being evaluated for, COVID-19 infection. Ask your healthcare provider to call the local or state health department.  Wear a facemask You should wear a facemask that covers your nose and mouth when you are in the same room with other people and when you visit a healthcare provider. People who live with or visit you should also wear a facemask while they are in the same room with you.  Separate yourself from other people in your home As much as possible, you should stay in a different room from other people in your home. Also, you should use a separate bathroom, if available.  Avoid sharing household items You should not share dishes, drinking glasses, cups, eating utensils, towels, bedding, or other items with other people in your home. After using these items, you should wash them thoroughly with soap and water.  Cover your coughs and sneezes Cover your mouth and nose with a tissue when you cough or sneeze, or you can cough or sneeze into your sleeve. Throw used tissues in a lined trash can, and immediately wash your hands with soap and water for at least 20 seconds or use an alcohol-based hand rub.  Wash your [employer] your hands often and thoroughly with soap and water for at least 20 seconds. You can use an alcohol-based hand sanitizer if soap and water are not available and if your hands are not visibly dirty. Avoid touching your eyes, nose, and mouth with unwashed hands.   Prevention Steps for Caregivers and Household Members of Individuals Confirmed to have, or Being Evaluated for, COVID-19 Infection Being Cared for in the Home  If you live with, or  provide care at home for, a person confirmed to have, or being evaluated for, COVID-19 infection please follow these guidelines to prevent infection:  Follow healthcare provider's instructions Make sure that you understand and can help the patient follow any healthcare provider instructions for all care.  Provide for the patient's basic needs You should help the patient with basic needs in the home and provide support for getting groceries, prescriptions, and other personal needs.  Monitor the patient's symptoms If they are getting sicker, call his or her medical provider and tell them that the patient has, or is being evaluated for, COVID-19 infection. This will help the healthcare provider's office take steps to keep other people from getting infected. Ask the healthcare provider to call the local or state health department.  Limit the number of people who have contact with the patient  If possible, have only one caregiver for the patient.  Other household members should stay in another home or place of residence. If this is not possible, they should stay  in another room, or be separated from the patient as much as possible. Use a separate bathroom, if available.  Restrict visitors who do not have an essential need to be in the home.  Keep older adults, very young children,  and other sick people away from the patient Keep older adults, very young children, and those who have compromised immune systems or chronic health conditions away from the patient. This includes people with chronic heart, lung, or kidney conditions, diabetes, and cancer.  Ensure good ventilation Make sure that shared spaces in the home have good air flow, such as from an air conditioner or an opened window, weather permitting.  Wash your hands often  Wash your hands often and thoroughly with soap and water for at least 20 seconds. You can use an alcohol based hand sanitizer if soap and water are not available  and if your hands are not visibly dirty.  Avoid touching your eyes, nose, and mouth with unwashed hands.  Use disposable paper towels to dry your hands. If not available, use dedicated cloth towels and replace them when they become wet.  Wear a facemask and gloves  Wear a disposable facemask at all times in the room and gloves when you touch or have contact with the patient's blood, body fluids, and/or secretions or excretions, such as sweat, saliva, sputum, nasal mucus, vomit, urine, or feces.  Ensure the mask fits over your nose and mouth tightly, and do not touch it during use.  Throw out disposable facemasks and gloves after using them. Do not reuse.  Wash your hands immediately after removing your facemask and gloves.  If your personal clothing becomes contaminated, carefully remove clothing and launder. Wash your hands after handling contaminated clothing.  Place all used disposable facemasks, gloves, and other waste in a lined container before disposing them with other household waste.  Remove gloves and wash your hands immediately after handling these items.  Do not share dishes, glasses, or other household items with the patient  Avoid sharing household items. You should not share dishes, drinking glasses, cups, eating utensils, towels, bedding, or other items with a patient who is confirmed to have, or being evaluated for, COVID-19 infection.  After the person uses these items, you should wash them thoroughly with soap and water.  Wash laundry thoroughly  Immediately remove and wash clothes or bedding that have blood, body fluids, and/or secretions or excretions, such as sweat, saliva, sputum, nasal mucus, vomit, urine, or feces, on them.  Wear gloves when handling laundry from the patient.  Read and follow directions on labels of laundry or clothing items and detergent. In general, wash and dry with the warmest temperatures recommended on the label.  Clean all areas the  individual has used often  Clean all touchable surfaces, such as counters, tabletops, doorknobs, bathroom fixtures, toilets, phones, keyboards, tablets, and bedside tables, every day. Also, clean any surfaces that may have blood, body fluids, and/or secretions or excretions on them.  Wear gloves when cleaning surfaces the patient has come in contact with.  Use a diluted bleach solution (e.g., dilute bleach with 1 part bleach and 10 parts water) or a household disinfectant with a label that says EPA-registered for coronaviruses. To make a bleach solution at home, add 1 tablespoon of bleach to 1 quart (4 cups) of water. For a larger supply, add  cup of bleach to 1 gallon (16 cups) of water.  Read labels of cleaning products and follow recommendations provided on product labels. Labels contain instructions for safe and effective use of the cleaning product including precautions you should take when applying the product, such as wearing gloves or eye protection and making sure you have good ventilation during use of the product.  Remove gloves and wash hands immediately after cleaning.  Monitor yourself for signs and symptoms of illness Caregivers and household members are considered close contacts, should monitor their health, and will be asked to limit movement outside of the home to the extent possible. Follow the monitoring steps for close contacts listed on the symptom monitoring form.   ? If you have additional questions, contact your local health department or call the epidemiologist on call at [phone] (available 24/7). ? This guidance is subject to change. For the most up-to-date guidance from CDC, please refer to their website: YouBlogs.pl    Activity:  As tolerated    Discharge Instructions    Call MD for:  difficulty breathing, headache or visual disturbances   Complete by: As directed    Diet - low sodium  heart healthy   Complete by: As directed    Discharge instructions   Complete by: As directed    1.)  3 weeks of isolation from 12/20/2019   Follow with Primary MD  Ladell Pier, MD in 1-2 weeks  Please get a complete blood count and chemistry panel checked by your Primary MD at your next visit, and again as instructed by your Primary MD.  Get Medicines reviewed and adjusted: Please take all your medications with you for your next visit with your Primary MD  Laboratory/radiological data: Please request your Primary MD to go over all hospital tests and procedure/radiological results at the follow up, please ask your Primary MD to get all Hospital records sent to his/her office.  In some cases, they will be blood work, cultures and biopsy results pending at the time of your discharge. Please request that your primary care M.D. follows up on these results.  Also Note the following: If you experience worsening of your admission symptoms, develop shortness of breath, life threatening emergency, suicidal or homicidal thoughts you must seek medical attention immediately by calling 911 or calling your MD immediately  if symptoms less severe.  You must read complete instructions/literature along with all the possible adverse reactions/side effects for all the Medicines you take and that have been prescribed to you. Take any new Medicines after you have completely understood and accpet all the possible adverse reactions/side effects.   Do not drive when taking Pain medications or sleeping medications (Benzodaizepines)  Do not take more than prescribed Pain, Sleep and Anxiety Medications. It is not advisable to combine anxiety,sleep and pain medications without talking with your primary care practitioner  Special Instructions: If you have smoked or chewed Tobacco  in the last 2 yrs please stop smoking, stop any regular Alcohol  and or any Recreational drug use.  Wear Seat belts while  driving.  Please note: You were cared for by a hospitalist during your hospital stay. Once you are discharged, your primary care physician will handle any further medical issues. Please note that NO REFILLS for any discharge medications will be authorized once you are discharged, as it is imperative that you return to your primary care physician (or establish a relationship with a primary care physician if you do not have one) for your post hospital discharge needs so that they can reassess your need for medications and monitor your lab values.   Increase activity slowly   Complete by: As directed      Allergies as of 01/05/2020      Reactions   Lisinopril    Headache, upset stomach and rectal bleeding   Milk-related Compounds Other (See Comments)  Gi upset      Medication List    STOP taking these medications   amLODipine 5 MG tablet Commonly known as: NORVASC   azithromycin 250 MG tablet Commonly known as: ZITHROMAX   cetirizine-pseudoephedrine 5-120 MG tablet Commonly known as: ZYRTEC-D   diclofenac 75 MG EC tablet Commonly known as: VOLTAREN   gabapentin 100 MG capsule Commonly known as: NEURONTIN     TAKE these medications   albuterol 108 (90 Base) MCG/ACT inhaler Commonly known as: VENTOLIN HFA Inhale 2 puffs into the lungs every 6 (six) hours. What changed:   when to take this  reasons to take this   benzonatate 200 MG capsule Commonly known as: TESSALON Take 1 capsule (200 mg total) by mouth 3 (three) times daily as needed for cough. What changed:   medication strength  how much to take  when to take this  reasons to take this   clonazePAM 0.25 MG disintegrating tablet Commonly known as: KLONOPIN Take 1 tablet (0.25 mg total) by mouth 2 (two) times daily as needed (anxiety).   dexamethasone 6 MG tablet Commonly known as: DECADRON Take 1 tablet (6 mg total) by mouth daily.   fluticasone 50 MCG/ACT nasal spray Commonly known as: Flonase Place 1  spray into both nostrils daily. What changed: how much to take   oxymetazoline 0.05 % nasal spray Commonly known as: AFRIN Place 2 sprays into both nostrils 2 (two) times daily as needed for congestion (epistaxis).      Follow-up Information    Marcine Matar, MD. Schedule an appointment as soon as possible for a visit in 1 week(s).   Specialty: Internal Medicine Contact information: [address] [place] [address] [phone]          Allergies  Allergen Reactions  . Lisinopril     Headache, upset stomach and rectal bleeding  . Milk-Related Compounds Other (See Comments)    Gi upset     Other Procedures/Studies: CT CHEST WO CONTRAST  Result Date: 12/30/2019 CLINICAL DATA:  Unresolved pneumonia. Diagnosed with COVID 415. Worsening respiratory symptoms. Hypoxia. EXAM: CT CHEST WITHOUT CONTRAST TECHNIQUE: Multidetector CT imaging of the chest was performed following the standard protocol without IV contrast. COMPARISON:  Radiograph earlier this day, also 12/25/2019. FINDINGS: Cardiovascular: Thoracic aorta is normal in caliber. Minor aortic atherosclerosis. Heart is normal in size. No pericardial effusion. Mediastinum/Nodes: Limited assessment for adenopathy given low lung volumes, patient motion, and lack of contrast. Scattered prominent mediastinal lymph nodes. Cannot assess for hilar adenopathy given lack of IV contrast. Distal esophagus slightly patulous. No visualized thyroid nodule. Lungs/Pleura: Low lung volumes with bibasilar dependent atelectasis/collapse. Heterogeneous bilateral peripheral opacities in the mid lower lung zone predominant distribution. No pleural fluid. No evidence of pulmonary edema. Trachea and mainstem bronchi are patent. Upper Abdomen: Mild bilateral perinephric edema is nonspecific. Musculoskeletal: There are no acute or suspicious osseous abnormalities. IMPRESSION: Low lung volumes with bibasilar dependent atelectasis/partial collapse.  Heterogeneous bilateral peripheral opacities in the mid lower lung zone predominant distribution, consistent with COVID-19 pneumonia. Aortic Atherosclerosis (ICD10-I70.0). Electronically Signed   By: Narda Rutherford M.D.   On: 12/30/2019 19:01   DG CHEST PORT 1 VIEW  Result Date: 01/01/2020 CLINICAL DATA:  Increased fatigue.  COVID-19 EXAM: PORTABLE CHEST 1 VIEW COMPARISON:  Two days ago FINDINGS: Low volume chest with patchy pulmonary infiltrate asymmetric to the subpleural left lung. Normal heart size for low volume inflation. No visible effusion or pneumothorax. IMPRESSION: Low volume chest with atypical  pneumonia, slightly increased on the left when compared to 2 days ago. Electronically Signed   By: Marnee Spring M.D.   On: 01/01/2020 05:27   DG Chest Port 1 View  Result Date: 12/30/2019 CLINICAL DATA:  COVID, hypoxia. EXAM: PORTABLE CHEST 1 VIEW COMPARISON:  Prior chest radiographs 12/25/2019 and earlier FINDINGS: Heart size within normal limits. Aortic atherosclerosis. Ill-defined airspace disease within the left mid to lower lung field has somewhat progressed. New from prior exam, there is ill-defined airspace disease within the right lung base. Small amount of fluid now tracking within the minor fissure. No definite pleural effusion. No evidence of pneumothorax. No acute bony abnormality. IMPRESSION: Ill-defined airspace disease within the left mid to lower lung, progressed. New airspace disease within the right lung base. Findings are consistent with multifocal pneumonia. Electronically Signed   By: Jackey Loge DO   On: 12/30/2019 14:37   DG Chest Port 1 View  Result Date: 12/25/2019 CLINICAL DATA:  Cough. COVID positive. EXAM: PORTABLE CHEST 1 VIEW COMPARISON:  Two-view chest x-ray 03/05/2017 FINDINGS: The heart size is normal. New left lower lobe airspace disease is present. Upper lobes are clear bilaterally. Right lung is clear. IMPRESSION: New left lower lobe airspace disease compatible  with pneumonia. Electronically Signed   By: Marin Roberts M.D.   On: 12/25/2019 06:32   DG Chest Port 1V same Day  Result Date: 01/02/2020 CLINICAL DATA:  Shortness of breath, history hypertension, former smoker, COVID-19 pneumonia EXAM: PORTABLE CHEST 1 VIEW COMPARISON:  Portable exam 1040 hours compared to 01/01/2020 FINDINGS: Normal heart size, mediastinal contours, and pulmonary vascularity. Bibasilar atelectasis with patchy infiltrates in the periphery of the mid to lower lungs bilaterally. Upper lungs clear. No pleural effusion or pneumothorax. IMPRESSION: Persistent peripheral infiltrates in the mid to lower lungs consistent with COVID-19 pneumonia. Bibasilar atelectasis. Electronically Signed   By: Ulyses Southward M.D.   On: 01/02/2020 10:48     TODAY-DAY OF DISCHARGE:  Subjective:   [name] today has no headache,no chest abdominal pain,no new weakness tingling or numbness, feels much better wants to go home today.   Objective:   Blood pressure 118/82, pulse 81, temperature 98.5 F (36.9 C), temperature source Oral, resp. rate 17, height  (1.575 m), weight 77.6 kg, last menstrual period 06/26/2013, SpO2 96 %.  Intake/Output Summary (Last 24 hours) at 01/05/2020 1031 Last data filed at 01/05/2020 0510 Gross per 24 hour  Intake 120 ml  Output 1000 ml  Net -880 ml   Filed Weights   12/30/19 1344  Weight: 77.6 kg    Exam: Awake Alert, Oriented *3, No new F.N deficits, Normal affect [place].AT,PERRAL Supple Neck,No JVD, No cervical lymphadenopathy appriciated.  Symmetrical Chest wall movement, Good air movement bilaterally, CTAB RRR,No Gallops,Rubs or new Murmurs, No Parasternal Heave +ve B.Sounds, Abd Soft, Non tender, No organomegaly appriciated, No rebound -guarding or rigidity. No Cyanosis, Clubbing or edema, No new Rash or bruise   PERTINENT RADIOLOGIC STUDIES: CT CHEST WO CONTRAST  Result Date: 12/30/2019 CLINICAL DATA:  Unresolved pneumonia. Diagnosed  with COVID 415. Worsening respiratory symptoms. Hypoxia. EXAM: CT CHEST WITHOUT CONTRAST TECHNIQUE: Multidetector CT imaging of the chest was performed following the standard protocol without IV contrast. COMPARISON:  Radiograph earlier this day, also 12/25/2019. FINDINGS: Cardiovascular: Thoracic aorta is normal in caliber. Minor aortic atherosclerosis. Heart is normal in size. No pericardial effusion. Mediastinum/Nodes: Limited assessment for adenopathy given low lung volumes, patient motion, and lack of contrast. Scattered prominent mediastinal lymph nodes.  Cannot assess for hilar adenopathy given lack of IV contrast. Distal esophagus slightly patulous. No visualized thyroid nodule. Lungs/Pleura: Low lung volumes with bibasilar dependent atelectasis/collapse. Heterogeneous bilateral peripheral opacities in the mid lower lung zone predominant distribution. No pleural fluid. No evidence of pulmonary edema. Trachea and mainstem bronchi are patent. Upper Abdomen: Mild bilateral perinephric edema is nonspecific. Musculoskeletal: There are no acute or suspicious osseous abnormalities. IMPRESSION: Low lung volumes with bibasilar dependent atelectasis/partial collapse. Heterogeneous bilateral peripheral opacities in the mid lower lung zone predominant distribution, consistent with COVID-19 pneumonia. Aortic Atherosclerosis (ICD10-I70.0). Electronically Signed   By: Narda Rutherford M.D.   On: 12/30/2019 19:01   DG CHEST PORT 1 VIEW  Result Date: 01/01/2020 CLINICAL DATA:  Increased fatigue.  COVID-19 EXAM: PORTABLE CHEST 1 VIEW COMPARISON:  Two days ago FINDINGS: Low volume chest with patchy pulmonary infiltrate asymmetric to the subpleural left lung. Normal heart size for low volume inflation. No visible effusion or pneumothorax. IMPRESSION: Low volume chest with atypical pneumonia, slightly increased on the left when compared to 2 days ago. Electronically Signed   By: Marnee Spring M.D.   On: 01/01/2020 05:27    DG Chest Port 1 View  Result Date: 12/30/2019 CLINICAL DATA:  COVID, hypoxia. EXAM: PORTABLE CHEST 1 VIEW COMPARISON:  Prior chest radiographs 12/25/2019 and earlier FINDINGS: Heart size within normal limits. Aortic atherosclerosis. Ill-defined airspace disease within the left mid to lower lung field has somewhat progressed. New from prior exam, there is ill-defined airspace disease within the right lung base. Small amount of fluid now tracking within the minor fissure. No definite pleural effusion. No evidence of pneumothorax. No acute bony abnormality. IMPRESSION: Ill-defined airspace disease within the left mid to lower lung, progressed. New airspace disease within the right lung base. Findings are consistent with multifocal pneumonia. Electronically Signed   By: Jackey Loge DO   On: 12/30/2019 14:37   DG Chest Port 1 View  Result Date: 12/25/2019 CLINICAL DATA:  Cough. COVID positive. EXAM: PORTABLE CHEST 1 VIEW COMPARISON:  Two-view chest x-ray 03/05/2017 FINDINGS: The heart size is normal. New left lower lobe airspace disease is present. Upper lobes are clear bilaterally. Right lung is clear. IMPRESSION: New left lower lobe airspace disease compatible with pneumonia. Electronically Signed   By: Marin Roberts M.D.   On: 12/25/2019 06:32   DG Chest Port 1V same Day  Result Date: 01/02/2020 CLINICAL DATA:  Shortness of breath, history hypertension, former smoker, COVID-19 pneumonia EXAM: PORTABLE CHEST 1 VIEW COMPARISON:  Portable exam 1040 hours compared to 01/01/2020 FINDINGS: Normal heart size, mediastinal contours, and pulmonary vascularity. Bibasilar atelectasis with patchy infiltrates in the periphery of the mid to lower lungs bilaterally. Upper lungs clear. No pleural effusion or pneumothorax. IMPRESSION: Persistent peripheral infiltrates in the mid to lower lungs consistent with COVID-19 pneumonia. Bibasilar atelectasis. Electronically Signed   By: Ulyses Southward M.D.   On: 01/02/2020  10:48     PERTINENT LAB RESULTS: CBC: Recent Labs    01/03/20 0510 01/04/20 0719  WBC 8.6 9.7  HGB 10.2* 10.6*  HCT 31.2* 31.6*  PLT 397 451*   CMET CMP     Component Value Date/Time   NA 138 01/04/2020 0719   K 4.2 01/04/2020 0719   CL 106 01/04/2020 0719   CO2 24 01/04/2020 0719   GLUCOSE 132 (H) 01/04/2020 0719   BUN 20 01/04/2020 0719   CREATININE 0.57 01/04/2020 0719   CALCIUM 8.4 (L) 01/04/2020 0719   PROT 5.7 (L)  01/04/2020 0719   ALBUMIN 2.6 (L) 01/04/2020 0719   AST 16 01/04/2020 0719   ALT 21 01/04/2020 0719   ALKPHOS 41 01/04/2020 0719   BILITOT 0.5 01/04/2020 0719   GFRNONAA >60 01/04/2020 0719   GFRAA >60 01/04/2020 0719    GFR Estimated Creatinine Clearance: 75.7 mL/min (by C-G formula based on SCr of 0.57 mg/dL). No results for input(s): LIPASE, AMYLASE in the last 72 hours. No results for input(s): CKTOTAL, CKMB, CKMBINDEX, TROPONINI in the last 72 hours. Invalid input(s): POCBNP Recent Labs    01/03/20 0510 01/04/20 0719  DDIMER 0.33 0.66*   No results for input(s): HGBA1C in the last 72 hours. No results for input(s): CHOL, HDL, LDLCALC, TRIG, CHOLHDL, LDLDIRECT in the last 72 hours. No results for input(s): TSH, T4TOTAL, T3FREE, THYROIDAB in the last 72 hours.  Invalid input(s): FREET3 No results for input(s): VITAMINB12, FOLATE, FERRITIN, TIBC, IRON, RETICCTPCT in the last 72 hours. Coags: No results for input(s): INR in the last 72 hours.  Invalid input(s): PT Microbiology: Recent Results (from the past 240 hour(s))  Blood Culture (routine x 2)     Status: None   Collection Time: 12/30/19  2:47 PM   Specimen: BLOOD  Result Value Ref Range Status   Specimen Description BLOOD RIGHT ANTECUBITAL  Final   Special Requests   Final    BOTTLES DRAWN AEROBIC AND ANAEROBIC Blood Culture adequate volume   Culture   Final    NO GROWTH 5 DAYS Performed at [facility], [address] [place], [place] [address]    Report Status  01/04/2020 FINAL  Final  Blood Culture (routine x 2)     Status: Abnormal   Collection Time: 12/30/19  2:47 PM   Specimen: BLOOD  Result Value Ref Range Status   Specimen Description BLOOD LEFT ANTECUBITAL  Final   Special Requests   Final    BOTTLES DRAWN AEROBIC AND ANAEROBIC Blood Culture adequate volume   Culture  Setup Time   Final    GRAM POSITIVE COCCI IN CLUSTERS AEROBIC BOTTLE ONLY Organism ID to follow CRITICAL RESULT CALLED TO, READ BACK BY AND VERIFIED WITH: Danella Maiers PharmD 13:20 12/31/19 ([email or web address])    Culture (A)  Final    STAPHYLOCOCCUS SPECIES (COAGULASE NEGATIVE) THE SIGNIFICANCE OF ISOLATING THIS ORGANISM FROM A SINGLE SET OF BLOOD CULTURES WHEN MULTIPLE SETS ARE DRAWN IS UNCERTAIN. PLEASE NOTIFY THE MICROBIOLOGY DEPARTMENT WITHIN ONE WEEK IF SPECIATION AND SENSITIVITIES ARE REQUIRED. Performed at [facility], [address], [place], [place] [address]    Report Status 01/02/2020 FINAL  Final  Blood Culture ID Panel (Reflexed)     Status: Abnormal   Collection Time: 12/30/19  2:47 PM  Result Value Ref Range Status   Enterococcus species NOT DETECTED NOT DETECTED Final   Listeria monocytogenes NOT DETECTED NOT DETECTED Final   Staphylococcus species DETECTED (A) NOT DETECTED Final    Comment: Methicillin (oxacillin) susceptible coagulase negative staphylococcus. Possible blood culture contaminant (unless isolated from more than one blood culture draw or clinical case suggests pathogenicity). No antibiotic treatment is indicated for blood  culture contaminants. CRITICAL RESULT CALLED TO, READ BACK BY AND VERIFIED WITH: Danella Maiers PharmD 13:20 12/31/19 ([email or web address])    Staphylococcus aureus (BCID) NOT DETECTED NOT DETECTED Final   Methicillin resistance NOT DETECTED NOT DETECTED Final   Streptococcus species NOT DETECTED NOT DETECTED Final   Streptococcus agalactiae NOT DETECTED NOT DETECTED Final   Streptococcus pneumoniae NOT DETECTED NOT DETECTED  Final   Streptococcus  pyogenes NOT DETECTED NOT DETECTED Final   Acinetobacter baumannii NOT DETECTED NOT DETECTED Final   Enterobacteriaceae species NOT DETECTED NOT DETECTED Final   Enterobacter cloacae complex NOT DETECTED NOT DETECTED Final   Escherichia coli NOT DETECTED NOT DETECTED Final   Klebsiella oxytoca NOT DETECTED NOT DETECTED Final   Klebsiella pneumoniae NOT DETECTED NOT DETECTED Final   Proteus species NOT DETECTED NOT DETECTED Final   Serratia marcescens NOT DETECTED NOT DETECTED Final   Haemophilus influenzae NOT DETECTED NOT DETECTED Final   Neisseria meningitidis NOT DETECTED NOT DETECTED Final   Pseudomonas aeruginosa NOT DETECTED NOT DETECTED Final   Candida albicans NOT DETECTED NOT DETECTED Final   Candida glabrata NOT DETECTED NOT DETECTED Final   Candida krusei NOT DETECTED NOT DETECTED Final   Candida parapsilosis NOT DETECTED NOT DETECTED Final   Candida tropicalis NOT DETECTED NOT DETECTED Final    Comment: Performed at [facility], [address] [place], [place] [address]  Culture, blood (Routine X 2) w Reflex to ID Panel     Status: None (Preliminary result)   Collection Time: 01/01/20  6:24 PM   Specimen: BLOOD RIGHT HAND  Result Value Ref Range Status   Specimen Description BLOOD RIGHT HAND  Final   Special Requests   Final    BOTTLES DRAWN AEROBIC AND ANAEROBIC Blood Culture adequate volume   Culture   Final    NO GROWTH 4 DAYS Performed at [facility], [address] [place], [place] [address]    Report Status PENDING  Incomplete  Culture, blood (Routine X 2) w Reflex to ID Panel     Status: None (Preliminary result)   Collection Time: 01/01/20  6:31 PM   Specimen: BLOOD LEFT HAND  Result Value Ref Range Status   Specimen Description BLOOD LEFT HAND  Final   Special Requests   Final    BOTTLES DRAWN AEROBIC AND ANAEROBIC Blood Culture adequate volume   Culture   Final    NO GROWTH 4 DAYS Performed at [facility], [address],  [place], [place] [address]    Report Status PENDING  Incomplete    FURTHER DISCHARGE INSTRUCTIONS:  Get Medicines reviewed and adjusted: Please take all your medications with you for your next visit with your Primary MD  Laboratory/radiological data: Please request your Primary MD to go over all hospital tests and procedure/radiological results at the follow up, please ask your Primary MD to get all Hospital records sent to his/her office.  In some cases, they will be blood work, cultures and biopsy results pending at the time of your discharge. Please request that your primary care M.D. goes through all the records of your hospital data and follows up on these results.  Also Note the following: If you experience worsening of your admission symptoms, develop shortness of breath, life threatening emergency, suicidal or homicidal thoughts you must seek medical attention immediately by calling 911 or calling your MD immediately  if symptoms less severe.  You must read complete instructions/literature along with all the possible adverse reactions/side effects for all the Medicines you take and that have been prescribed to you. Take any new Medicines after you have completely understood and accpet all the possible adverse reactions/side effects.   Do not drive when taking Pain medications or sleeping medications (Benzodaizepines)  Do not take more than prescribed Pain, Sleep and Anxiety Medications. It is not advisable to combine anxiety,sleep and  pain medications without talking with your primary care practitioner  Special Instructions: If you have smoked or chewed Tobacco  in the last 2 yrs please stop smoking, stop any regular Alcohol  and or any Recreational drug use.  Wear Seat belts while driving.  Please note: You were cared for by a hospitalist during your hospital stay. Once you are discharged, your primary care physician will handle any further medical issues. Please note that NO REFILLS  for any discharge medications will be authorized once you are discharged, as it is imperative that you return to your primary care physician (or establish a relationship with a primary care physician if you do not have one) for your post hospital discharge needs so that they can reassess your need for medications and monitor your lab values.  Total Time spent coordinating discharge including counseling, education and face to face time equals 35 minutes.  SignedJeoffrey Massed 01/05/2020 10:31 AM

## 2020-01-06 ENCOUNTER — Telehealth: Payer: Self-pay

## 2020-01-06 LAB — CULTURE, BLOOD (ROUTINE X 2)
Culture: NO GROWTH
Culture: NO GROWTH
Special Requests: ADEQUATE
Special Requests: ADEQUATE

## 2020-01-06 NOTE — Telephone Encounter (Signed)
Transition Care Management Follow-up Telephone Call Date of discharge and from where: 01/05/2020, [facility].  Call placed to patient. She explained that she is tired but no other concerns.  She said she has all of her medications and does not have any questions about the meds or discharge instructions at this time.  She said she is remaining on isolation at home.  She then said that her PCP is now at [facility] and she will be following up there.  She was in agreement to having Dr Laural Benes removed as her PCP listed in [place],

## 2020-04-16 ENCOUNTER — Emergency Department (HOSPITAL_COMMUNITY)
Admission: EM | Admit: 2020-04-16 | Discharge: 2020-04-16 | Disposition: A | Discharge status: Home / Self Care | Payer: No Typology Code available for payment source | Attending: Emergency Medicine | Admitting: Emergency Medicine

## 2020-04-16 ENCOUNTER — Other Ambulatory Visit: Payer: Self-pay

## 2020-04-16 ENCOUNTER — Emergency Department (HOSPITAL_COMMUNITY): Payer: No Typology Code available for payment source

## 2020-04-16 DIAGNOSIS — I10 Essential (primary) hypertension: Secondary | ICD-10-CM | POA: Insufficient documentation

## 2020-04-16 DIAGNOSIS — R079 Chest pain, unspecified: Secondary | ICD-10-CM

## 2020-04-16 DIAGNOSIS — Z8616 Personal history of COVID-19: Secondary | ICD-10-CM | POA: Diagnosis not present

## 2020-04-16 DIAGNOSIS — Z87891 Personal history of nicotine dependence: Secondary | ICD-10-CM | POA: Insufficient documentation

## 2020-04-16 DIAGNOSIS — R0789 Other chest pain: Secondary | ICD-10-CM | POA: Insufficient documentation

## 2020-04-16 DIAGNOSIS — R110 Nausea: Secondary | ICD-10-CM | POA: Diagnosis not present

## 2020-04-16 LAB — CBC
HCT: 37 % (ref 36.0–46.0)
Hemoglobin: 12.3 g/dL (ref 12.0–15.0)
MCH: 30.2 pg (ref 26.0–34.0)
MCHC: 33.2 g/dL (ref 30.0–36.0)
MCV: 90.9 fL (ref 80.0–100.0)
Platelets: 404 10*3/uL — ABNORMAL HIGH (ref 150–400)
RBC: 4.07 MIL/uL (ref 3.87–5.11)
RDW: 11.6 % (ref 11.5–15.5)
WBC: 5.8 10*3/uL (ref 4.0–10.5)
nRBC: 0 % (ref 0.0–0.2)

## 2020-04-16 LAB — BASIC METABOLIC PANEL
Anion gap: 11 (ref 5–15)
BUN: 17 mg/dL (ref 6–20)
CO2: 26 mmol/L (ref 22–32)
Calcium: 9.4 mg/dL (ref 8.9–10.3)
Chloride: 105 mmol/L (ref 98–111)
Creatinine, Ser: 0.71 mg/dL (ref 0.44–1.00)
GFR calc Af Amer: >60 mL/min (ref >60)
GFR calc non Af Amer: >60 mL/min (ref >60)
Glucose, Bld: 119 mg/dL — ABNORMAL HIGH (ref 70–99)
Potassium: 3.8 mmol/L (ref 3.5–5.1)
Sodium: 142 mmol/L (ref 135–145)

## 2020-04-16 LAB — TROPONIN I (HIGH SENSITIVITY)
Troponin I (High Sensitivity): <2 ng/L (ref <18)
Troponin I (High Sensitivity): <2 ng/L (ref <18)

## 2020-04-16 MED ORDER — LIDOCAINE VISCOUS HCL 2 % MT SOLN
15.0000 mL | Freq: Once | OROMUCOSAL | Status: AC
  Administered 2020-04-16: 15 mL via ORAL
  Filled 2020-04-16: qty 15

## 2020-04-16 MED ORDER — IBUPROFEN 400 MG PO TABS
400.0000 mg | ORAL_TABLET | Freq: Once | ORAL | Status: AC
  Administered 2020-04-16: 400 mg via ORAL
  Filled 2020-04-16: qty 1

## 2020-04-16 MED ORDER — DIAZEPAM 5 MG PO TABS
5.0000 mg | ORAL_TABLET | Freq: Once | ORAL | Status: AC
  Administered 2020-04-16: 5 mg via ORAL
  Filled 2020-04-16: qty 1

## 2020-04-16 MED ORDER — ALUM & MAG HYDROXIDE-SIMETH 200-200-20 MG/5ML PO SUSP
30.0000 mL | Freq: Once | ORAL | Status: AC
  Administered 2020-04-16: 30 mL via ORAL
  Filled 2020-04-16: qty 30

## 2020-04-16 NOTE — ED Triage Notes (Signed)
Pt c/o left sided CP that began yesterday. Also c/o nausea.

## 2020-04-16 NOTE — ED Notes (Signed)
Pt discharge instructions and follow-up care reviewed with the patient. The patient verbalized understanding of instructions. Pt discharged. 

## 2020-04-16 NOTE — ED Provider Notes (Signed)
I saw and evaluated the patient, reviewed the resident's note and I agree with the findings and plan.  EKG: EKG Interpretation  Date/Time:  Thursday April 16 2020 03:44:36 EDT Ventricular Rate:  102 PR Interval:  120 QRS Duration: 76 QT Interval:  344 QTC Calculation: 448 R Axis:   52 Text Interpretation: Sinus tachycardia Otherwise normal ECG Confirmed by Lorre Nick ([id number]) on 04/16/2020 7:37:53 AM 54 year old female presents with left-sided pleuritic chest discomfort which she has had all of her life.  Pain is worse with certain movements.  Denies any associated PE type symptoms.  Suspect muscular etiology.  Work appears reassuring.  Will discharge return precautions   Lorre Nick, MD 04/16/20 [phone]

## 2020-04-16 NOTE — ED Provider Notes (Signed)
[facility] EMERGENCY DEPARTMENT Provider Note   CSN: [id number] Arrival date & time: 04/16/20  3500     History Chief Complaint  Patient presents with  . Chest Pain    [name] is a 54 y.o. female.  HPI   Patient presents to the emergency department for chest pain.  The patient states she was laying in bed when she developed a sharp pain on the left side of her chest wall/breast area.  She describes the pain as a knifelike sensation.  She notes she has had these pains intermittently since childhood but normally they go away pretty quickly; this pain did not so she presented to the emergency department for evaluation.  No previous history of DVT.  No recent travel.  No shortness of breath.  Patient denies any fevers or chills.  Patient does have a history of COVID-19 multifocal pneumonia in the spring.  No unilateral leg swelling.  No urinary symptoms.  Some nausea but no vomiting.  No diarrhea.  Patient states the sharp nature of the pain has been persistent but she also developed a squeezing sensation around the chest.  No treatments attempted prior to arrival.   HPI: A 54 year old patient presents for evaluation of chest pain. Initial onset of pain was approximately 1-3 hours ago. The patient's chest pain is sharp and is not worse with exertion. The patient complains of nausea. The patient's chest pain is middle- or left-sided, is not well-localized, is not described as heaviness/pressure/tightness and does not radiate to the arms/jaw/neck. The patient denies diaphoresis. The patient has a family history of coronary artery disease in a first-degree relative with onset less than age 60. The patient has no history of stroke, has no history of peripheral artery disease, has not smoked in the past 90 days, denies any history of treated diabetes, is not hypertensive, has no history of hypercholesterolemia and does not have an elevated BMI (>=30).   Past Medical  History:  Diagnosis Date  . Allergy   . Anemia   . Atypical chest pain 1/11   Pain is not reproducible with exertion.   . Degenerative disk disease    No radiologic evicence in EMR  . Hyperlipidemia   . Tobacco abuse     Patient Active Problem List   Diagnosis Date Noted  . COVID-19 virus infection 12/30/2019  . COVID-19 12/30/2019  . Multifocal pneumonia   . Acute respiratory failure with hypoxia (HCC)   . Former smoker 04/06/2017  . Obesity (BMI 30.0-34.9) 04/06/2017  . Essential hypertension 04/06/2017  . Hot flashes due to menopause 04/06/2017  . Acute pain of left shoulder 04/06/2017  . Infertility, female, secondary 10/24/2011  . HYPERLIPIDEMIA 09/07/2009    Past Surgical History:  Procedure Laterality Date  . Other ORIF  2/09    Fracture of the radial head with  intraarticular extension and joint.     OB History   No obstetric history on file.     Family History  Problem Relation Age of Onset  . Breast cancer Mother   . Cancer Mother   . Cancer Father        Unknown type.  Marland Kitchen Heart disease Father   . Cancer Brother        LUNG    Social History   Tobacco Use  . Smoking status: Former Smoker    Quit date: 10/23/2009    Years since quitting: 10.4  . Smokeless tobacco: Never Used  Substance Use Topics  .  Alcohol use: Yes    Comment: RARE  . Drug use: No    Home Medications Prior to Admission medications   Medication Sig Start Date End Date Taking? Authorizing Provider  albuterol (VENTOLIN HFA) 108 (90 Base) MCG/ACT inhaler Inhale 2 puffs into the lungs every 6 (six) hours. Patient taking differently: Inhale 2 puffs into the lungs every 6 (six) hours as needed for wheezing or shortness of breath.  01/05/20  Yes Ghimire, Werner Lean, MD  ibuprofen (ADVIL) 200 MG tablet Take 600-800 mg by mouth every 6 (six) hours as needed for headache or mild pain.   Yes [provider]  benzonatate (TESSALON) 200 MG capsule Take 1 capsule (200 mg total) by  mouth 3 (three) times daily as needed for cough. Patient not taking: Reported on 04/16/2020 01/05/20   Maretta Bees, MD  clonazePAM (KLONOPIN) 0.25 MG disintegrating tablet Take 1 tablet (0.25 mg total) by mouth 2 (two) times daily as needed (anxiety). Patient not taking: Reported on 04/16/2020 01/05/20   Maretta Bees, MD  dexamethasone (DECADRON) 6 MG tablet Take 1 tablet (6 mg total) by mouth daily. Patient not taking: Reported on 04/16/2020 01/05/20   Maretta Bees, MD  fluticasone [facility]) 50 MCG/ACT nasal spray Place 1 spray into both nostrils daily. Patient not taking: Reported on 04/16/2020 01/05/20 01/04/21  Maretta Bees, MD  oxymetazoline (AFRIN) 0.05 % nasal spray Place 2 sprays into both nostrils 2 (two) times daily as needed for congestion (epistaxis). Patient not taking: Reported on 04/16/2020 01/05/20   Maretta Bees, MD    Allergies    Lisinopril and Milk-related compounds  Review of Systems   Review of Systems  Constitutional: Negative for chills and fever.  HENT: Negative for ear pain and sore throat.   Eyes: Negative for pain and visual disturbance.  Respiratory: Negative for cough and shortness of breath.   Cardiovascular: Positive for chest pain. Negative for palpitations and leg swelling.  Gastrointestinal: Positive for nausea. Negative for abdominal pain and vomiting.  Genitourinary: Negative for dysuria and hematuria.  Musculoskeletal: Negative for arthralgias and back pain.  Skin: Negative for color change and rash.  Neurological: Negative for seizures and syncope.  All other systems reviewed and are negative.   Physical Exam Updated Vital Signs BP (!) 147/97   Pulse 75   Temp 98.6 F (37 C) (Oral)   Resp 12   Ht 5\' 2"  (1.575 m)   Wt 74.8 kg   LMP 06/26/2013   SpO2 98%   BMI 30.18 kg/m   Physical Exam Vitals and nursing note reviewed.  Constitutional:      General: She is not in acute distress.    Appearance: She is well-developed  and normal weight. She is not ill-appearing or toxic-appearing.  HENT:     Head: Normocephalic and atraumatic.     Right Ear: Tympanic membrane normal.  Eyes:     Extraocular Movements: Extraocular movements intact.     Conjunctiva/sclera: Conjunctivae normal.     Pupils: Pupils are equal, round, and reactive to light.  Cardiovascular:     Rate and Rhythm: Normal rate and regular rhythm.     Heart sounds: Normal heart sounds. No murmur heard.   Pulmonary:     Effort: Pulmonary effort is normal. No respiratory distress.     Breath sounds: Normal breath sounds.  Abdominal:     General: There is no distension.     Palpations: Abdomen is soft.  Tenderness: There is no abdominal tenderness.  Musculoskeletal:     Cervical back: Normal range of motion and neck supple. No rigidity.     Right lower leg: No edema.     Left lower leg: No edema.  Skin:    General: Skin is warm and dry.     Capillary Refill: Capillary refill takes less than 2 seconds.  Neurological:     General: No focal deficit present.     Mental Status: She is alert and oriented to person, place, and time. Mental status is at baseline.     Motor: No weakness.  Psychiatric:        Mood and Affect: Mood normal.        Behavior: Behavior normal.     ED Results / Procedures / Treatments   Labs (all labs ordered are listed, but only abnormal results are displayed) Labs Reviewed  BASIC METABOLIC PANEL - Abnormal; Notable for the following components:      Result Value   Glucose, Bld 119 (*)    All other components within normal limits  CBC - Abnormal; Notable for the following components:   Platelets 404 (*)    All other components within normal limits  TROPONIN I (HIGH SENSITIVITY)  TROPONIN I (HIGH SENSITIVITY)    EKG EKG Interpretation  Date/Time:  Thursday April 16 2020 03:44:36 EDT Ventricular Rate:  102 PR Interval:  120 QRS Duration: 76 QT Interval:  344 QTC Calculation: 448 R Axis:   52 Text  Interpretation: Sinus tachycardia Otherwise normal ECG Confirmed by Lorre Nick ([id number]) on 04/16/2020 7:48:33 AM   Radiology DG Chest 2 View  Result Date: 04/16/2020 CLINICAL DATA:  Chest pain EXAM: CHEST - 2 VIEW COMPARISON:  01/02/2020 FINDINGS: The heart size and mediastinal contours are within normal limits. Both lungs are clear. The visualized skeletal structures are unremarkable. IMPRESSION: No active cardiopulmonary disease. Electronically Signed   By: Jasmine Pang M.D.   On: 04/16/2020 04:06    Procedures Procedures (including critical care time)  Medications Ordered in ED Medications  alum & mag hydroxide-simeth (MAALOX/MYLANTA) 200-200-20 MG/5ML suspension 30 mL (30 mLs Oral Given 04/16/20 0750)    And  lidocaine (XYLOCAINE) 2 % viscous mouth solution 15 mL (15 mLs Oral Given 04/16/20 0750)  ibuprofen (ADVIL) tablet 400 mg (400 mg Oral Given 04/16/20 0821)  diazepam (VALIUM) tablet 5 mg (5 mg Oral Given 04/16/20 6045)    ED Course   [name] is a 54 y.o. female with PMHx listed that presents to the Emergency Department complaint of Chest Pain  ED Course: Initial exam completed.   Well.  Hemodynamically stable.  Nontoxic and afebrile.  Physical exam significant for age-appropriate 54 year old female with clear breath sounds bilaterally, abdomen soft, nondistended, nonfocally tender, and strong peripheral radial pulses.  Initial differential includes acute coronary syndrome, pneumonia, pneumothorax, pulmonary embolism, esophageal rupture, dissection, and GERD. HEAR score 2.   Triage work-up reviewed.  Troponin negative x2.  CBC without evidence of leukocytosis or leukopenia and stable hemoglobin.  BMP with no acute electrolyte abnormalities requiring urgent intervention.  CXR without evidence of active cardiopulmonary disease.  EKG sinus tachycardia rate 102, otherwise normal intervals, without evidence of acute ST elevation or depression.  Overall, low concern  for acute coronary syndrome at this time.  Doubt PE as patient has no evidence of unilateral leg swelling, tachycardia, troponin elevation, previous history of DVT, or other risk factors at this time.  Likely musculoskeletal in nature.  Minimal relief with GI cocktail.  Patient did get relief with ibuprofen/Valium.  Patient will be asked to follow-up with her primary care physician.  Topical lidocaine patches as needed.  Ibuprofen as needed as well.   Diagnostics Vital Signs: reviewed Labs: reviewed and significant findings discussed above Imaging: personally reviewed images interpreted by radiology EKG: reviewed Records: nursing notes along with previous records reviewed and pertinent data discussed   Consults:  None   Reevaluation/Disposition:  Upon reevaluation, patients symptoms stable/improved. No active nausea/vomiting and ambulatory without assistance prior to discharge from the emergency department.    All questions answered.  Strict return precautions were discussed. Additionally we discussed establishing and/or following-up with primary care physician.  Patient and/or family was understanding and in agreement with today's assessment and plan.   Alex Alexzavier Girardin, MD Emergency Medicine, [facility]   Note: Dragon medical dictation software was used in the creation of this note.   Final Clinical Impression(s) / ED Diagnoses Final diagnoses:  Chest pain, unspecified type    Rx / DC Orders ED Discharge Orders    None       Nino ParsleyGross, Kathy Wahid, MD 04/16/20 Windell [id number]    Lorre NickAllen, Anthony, MD 04/17/20 1013

## 2020-04-16 NOTE — Discharge Instructions (Signed)
Please follow-up with your primary care doctor.  Return to ED for any worsening or concerning symptoms.  Ibuprofen 400mg  every 8 hours as needed for pain.  You may obtain topical lidocaine patches from pharmacy and place directly over your area of pain.

## 2020-12-28 ENCOUNTER — Emergency Department (HOSPITAL_COMMUNITY): Payer: BC Managed Care – PPO

## 2020-12-28 ENCOUNTER — Emergency Department (HOSPITAL_COMMUNITY)
Admission: EM | Admit: 2020-12-28 | Discharge: 2020-12-28 | Disposition: A | Discharge status: Home / Self Care | Payer: BC Managed Care – PPO | Attending: Emergency Medicine | Admitting: Emergency Medicine

## 2020-12-28 ENCOUNTER — Other Ambulatory Visit: Payer: Self-pay

## 2020-12-28 ENCOUNTER — Encounter (HOSPITAL_COMMUNITY): Payer: Self-pay

## 2020-12-28 DIAGNOSIS — Z8616 Personal history of COVID-19: Secondary | ICD-10-CM | POA: Diagnosis not present

## 2020-12-28 DIAGNOSIS — R1030 Lower abdominal pain, unspecified: Secondary | ICD-10-CM

## 2020-12-28 DIAGNOSIS — R110 Nausea: Secondary | ICD-10-CM | POA: Diagnosis not present

## 2020-12-28 DIAGNOSIS — K625 Hemorrhage of anus and rectum: Secondary | ICD-10-CM | POA: Diagnosis not present

## 2020-12-28 DIAGNOSIS — E871 Hypo-osmolality and hyponatremia: Secondary | ICD-10-CM | POA: Diagnosis not present

## 2020-12-28 DIAGNOSIS — Z87891 Personal history of nicotine dependence: Secondary | ICD-10-CM | POA: Insufficient documentation

## 2020-12-28 DIAGNOSIS — I10 Essential (primary) hypertension: Secondary | ICD-10-CM | POA: Diagnosis not present

## 2020-12-28 DIAGNOSIS — K921 Melena: Secondary | ICD-10-CM

## 2020-12-28 DIAGNOSIS — R197 Diarrhea, unspecified: Secondary | ICD-10-CM

## 2020-12-28 LAB — CBC
HCT: 39.6 % (ref 36.0–46.0)
Hemoglobin: 13.1 g/dL (ref 12.0–15.0)
MCH: 30.5 pg (ref 26.0–34.0)
MCHC: 33.1 g/dL (ref 30.0–36.0)
MCV: 92.3 fL (ref 80.0–100.0)
Platelets: 386 10*3/uL (ref 150–400)
RBC: 4.29 MIL/uL (ref 3.87–5.11)
RDW: 12.1 % (ref 11.5–15.5)
WBC: 10.5 10*3/uL (ref 4.0–10.5)
nRBC: 0 % (ref 0.0–0.2)

## 2020-12-28 LAB — COMPREHENSIVE METABOLIC PANEL
ALT: 21 U/L (ref 0–44)
AST: 24 U/L (ref 15–41)
Albumin: 4.2 g/dL (ref 3.5–5.0)
Alkaline Phosphatase: 59 U/L (ref 38–126)
Anion gap: 13 (ref 5–15)
BUN: 29 mg/dL — ABNORMAL HIGH (ref 6–20)
CO2: 19 mmol/L — ABNORMAL LOW (ref 22–32)
Calcium: 9.9 mg/dL (ref 8.9–10.3)
Chloride: 102 mmol/L (ref 98–111)
Creatinine, Ser: 0.64 mg/dL (ref 0.44–1.00)
GFR, Estimated: >60 mL/min (ref >60)
Glucose, Bld: 133 mg/dL — ABNORMAL HIGH (ref 70–99)
Potassium: 3.9 mmol/L (ref 3.5–5.1)
Sodium: 134 mmol/L — ABNORMAL LOW (ref 135–145)
Total Bilirubin: 1 mg/dL (ref 0.3–1.2)
Total Protein: 7.5 g/dL (ref 6.5–8.1)

## 2020-12-28 LAB — URINALYSIS, ROUTINE W REFLEX MICROSCOPIC
Bacteria, UA: NONE SEEN
Bilirubin Urine: NEGATIVE
Glucose, UA: NEGATIVE mg/dL
Ketones, ur: 20 mg/dL — AB
Nitrite: NEGATIVE
Protein, ur: NEGATIVE mg/dL
Specific Gravity, Urine: 1.019 (ref 1.005–1.030)
pH: 5 (ref 5.0–8.0)

## 2020-12-28 LAB — LIPASE, BLOOD: Lipase: 33 U/L (ref 11–51)

## 2020-12-28 LAB — POC OCCULT BLOOD, ED: Fecal Occult Bld: POSITIVE — AB

## 2020-12-28 MED ORDER — ONDANSETRON 4 MG PO TBDP: ORAL_TABLET | ORAL | 0 refills | Status: AC

## 2020-12-28 MED ORDER — MORPHINE SULFATE (PF) 4 MG/ML IV SOLN
4.0000 mg | Freq: Once | INTRAVENOUS | Status: AC
  Administered 2020-12-28: 4 mg via INTRAVENOUS
  Filled 2020-12-28: qty 1

## 2020-12-28 MED ORDER — IOHEXOL 300 MG/ML  SOLN
100.0000 mL | Freq: Once | INTRAMUSCULAR | Status: AC | PRN
  Administered 2020-12-28: 100 mL via INTRAVENOUS

## 2020-12-28 MED ORDER — DICYCLOMINE HCL 20 MG PO TABS
20.0000 mg | ORAL_TABLET | Freq: Two times a day (BID) | ORAL | 0 refills | Status: AC
Start: 2020-12-28 — End: ?

## 2020-12-28 MED ORDER — SODIUM CHLORIDE 0.9 % IV BOLUS
1000.0000 mL | Freq: Once | INTRAVENOUS | Status: AC
  Administered 2020-12-28: 1000 mL via INTRAVENOUS

## 2020-12-28 MED ORDER — ONDANSETRON HCL 4 MG/2ML IJ SOLN
4.0000 mg | Freq: Once | INTRAMUSCULAR | Status: AC
  Administered 2020-12-28: 4 mg via INTRAVENOUS
  Filled 2020-12-28: qty 2

## 2020-12-28 NOTE — ED Notes (Signed)
Reviewed discharge instructions with patient and family. Follow-up care and medications reviewed. Patient and family verbalized understanding. Patient A&Ox4, VSS, and ambulatory with steady gait upon discharge.  

## 2020-12-28 NOTE — ED Provider Notes (Signed)
[facility] EMERGENCY DEPARTMENT Provider Note   CSN: [id number] Arrival date & time: 12/28/20  5102     History Chief Complaint  Patient presents with  . Abdominal Pain  . Diarrhea    Shayden Richie Vadala is a 55 y.o. female.  Jerrica Tawny Raspberry is a 55 y.o. female with a history of hyperlipidemia, arthritis, anemia, who presents with lower abdominal pain and diarrhea. These symptoms started around 11 pm last night. Initially pain was coming and going every 10 minutes, but have now become more constant. Reports when pain comes on she feels a bit light headed, no syncope. Patient reports she has been nauseated, but no vomiting. This morning she started to noted dark red blood in her stools, stools have been watery and runny.  Reports chills but no fevers.  No prior abdominal surgeries.  The only time she has had blood in her stool was after a polyp biopsy a few years ago.  She does report taking ibuprofen typically every other day.  Drinks very occasionally, has not had any alcohol in the past year, denies other drug use.  Patient is not on any blood thinners.  No medications prior to arrival.  No other aggravating or alleviating factors.        Past Medical History:  Diagnosis Date  . Allergy   . Anemia   . Atypical chest pain 1/11   Pain is not reproducible with exertion.   . Degenerative disk disease    No radiologic evicence in EMR  . Hyperlipidemia   . Tobacco abuse     Patient Active Problem List   Diagnosis Date Noted  . COVID-19 virus infection 12/30/2019  . COVID-19 12/30/2019  . Multifocal pneumonia   . Acute respiratory failure with hypoxia (HCC)   . Former smoker 04/06/2017  . Obesity (BMI 30.0-34.9) 04/06/2017  . Essential hypertension 04/06/2017  . Hot flashes due to menopause 04/06/2017  . Acute pain of left shoulder 04/06/2017  . Infertility, female, secondary 10/24/2011  . HYPERLIPIDEMIA 09/07/2009    Past Surgical  History:  Procedure Laterality Date  . Other ORIF  2/09    Fracture of the radial head with  intraarticular extension and joint.     OB History   No obstetric history on file.     Family History  Problem Relation Age of Onset  . Breast cancer Mother   . Cancer Mother   . Cancer Father        Unknown type.  Marland Kitchen Heart disease Father   . Cancer Brother        LUNG    Social History   Tobacco Use  . Smoking status: Former Smoker    Quit date: 10/23/2009    Years since quitting: 11.1  . Smokeless tobacco: Never Used  Substance Use Topics  . Alcohol use: Yes    Comment: RARE  . Drug use: No    Home Medications Prior to Admission medications   Medication Sig Start Date End Date Taking? Authorizing Provider  albuterol (VENTOLIN HFA) 108 (90 Base) MCG/ACT inhaler Inhale 2 puffs into the lungs every 6 (six) hours. Patient taking differently: Inhale 2 puffs into the lungs every 6 (six) hours as needed for wheezing or shortness of breath.  01/05/20   Ghimire, Werner Lean, MD  benzonatate (TESSALON) 200 MG capsule Take 1 capsule (200 mg total) by mouth 3 (three) times daily as needed for cough. Patient not taking: Reported on 04/16/2020 01/05/20  Ghimire, Werner Lean, MD  clonazePAM (KLONOPIN) 0.25 MG disintegrating tablet Take 1 tablet (0.25 mg total) by mouth 2 (two) times daily as needed (anxiety). Patient not taking: Reported on 04/16/2020 01/05/20   Maretta Bees, MD  dexamethasone (DECADRON) 6 MG tablet Take 1 tablet (6 mg total) by mouth daily. Patient not taking: Reported on 04/16/2020 01/05/20   Maretta Bees, MD  fluticasone [facility]) 50 MCG/ACT nasal spray Place 1 spray into both nostrils daily. Patient not taking: Reported on 04/16/2020 01/05/20 01/04/21  Maretta Bees, MD  ibuprofen (ADVIL) 200 MG tablet Take 600-800 mg by mouth every 6 (six) hours as needed for headache or mild pain.    [provider]  oxymetazoline (AFRIN) 0.05 % nasal spray Place 2 sprays into  both nostrils 2 (two) times daily as needed for congestion (epistaxis). Patient not taking: Reported on 04/16/2020 01/05/20   Maretta Bees, MD    Allergies    Lisinopril and Milk-related compounds  Review of Systems   Review of Systems  Constitutional: Positive for chills. Negative for fever.  HENT: Negative.   Respiratory: Negative for cough and shortness of breath.   Cardiovascular: Negative for chest pain.  Gastrointestinal: Positive for abdominal pain, blood in stool, diarrhea and nausea. Negative for vomiting.  Genitourinary: Negative for dysuria, flank pain, frequency, vaginal bleeding and vaginal discharge.  Musculoskeletal: Negative for arthralgias and myalgias.  Skin: Negative for color change and rash.  Neurological: Negative for dizziness, syncope and light-headedness.  All other systems reviewed and are negative.   Physical Exam Updated Vital Signs BP (!) 206/109 (BP Location: Left Arm)   Pulse (!) 103   Temp 98.2 F (36.8 C) (Oral)   Resp 20   Ht 5\' 2"  (1.575 m)   Wt 75 kg   LMP 06/26/2013   SpO2 96%   BMI 30.24 kg/m   Physical Exam Vitals and nursing note reviewed.  Constitutional:      General: She is not in acute distress.    Appearance: She is well-developed. She is not diaphoretic.     Comments: Alert, appears uncomfortable but is in no acute distress.  HENT:     Head: Normocephalic and atraumatic.     Mouth/Throat:     Mouth: Mucous membranes are moist.     Pharynx: Oropharynx is clear.  Eyes:     General:        Right eye: No discharge.        Left eye: No discharge.     Pupils: Pupils are equal, round, and reactive to light.  Cardiovascular:     Rate and Rhythm: Normal rate and regular rhythm.     Heart sounds: Normal heart sounds.  Pulmonary:     Effort: Pulmonary effort is normal. No respiratory distress.     Breath sounds: Normal breath sounds. No wheezing or rales.     Comments: Respirations equal and unlabored, patient able to  speak in full sentences, lungs clear to auscultation bilaterally  Abdominal:     General: Bowel sounds are normal. There is no distension.     Palpations: Abdomen is soft. There is no mass.     Tenderness: There is abdominal tenderness in the suprapubic area and left lower quadrant. There is no guarding.     Comments: Abdomen is soft, nondistended, bowel sounds present throughout, there is some tenderness present in the left lower quadrant and suprapubic region without guarding or rebound tenderness, all other quadrants nontender, no CVA  tenderness.  Musculoskeletal:        General: No deformity.     Cervical back: Neck supple.  Skin:    General: Skin is warm and dry.     Capillary Refill: Capillary refill takes less than 2 seconds.  Neurological:     Mental Status: She is alert.     Coordination: Coordination normal.     Comments: Speech is clear, able to follow commands Moves extremities without ataxia, coordination intact  Psychiatric:        Mood and Affect: Mood normal.        Behavior: Behavior normal.     ED Results / Procedures / Treatments   Labs (all labs ordered are listed, but only abnormal results are displayed) Labs Reviewed  COMPREHENSIVE METABOLIC PANEL - Abnormal; Notable for the following components:      Result Value   Sodium 134 (*)    CO2 19 (*)    Glucose, Bld 133 (*)    BUN 29 (*)    All other components within normal limits  URINALYSIS, ROUTINE W REFLEX MICROSCOPIC - Abnormal; Notable for the following components:   Color, Urine STRAW (*)    Hgb urine dipstick SMALL (*)    Ketones, ur 20 (*)    Leukocytes,Ua SMALL (*)    All other components within normal limits  POC OCCULT BLOOD, ED - Abnormal; Notable for the following components:   Fecal Occult Bld POSITIVE (*)    All other components within normal limits  GASTROINTESTINAL PANEL BY PCR, STOOL (REPLACES STOOL CULTURE)  LIPASE, BLOOD  CBC    EKG None  Radiology CT ABDOMEN PELVIS W  CONTRAST  Result Date: 12/28/2020 CLINICAL DATA:  Acute onset left-sided abdominal pain with nausea. EXAM: CT ABDOMEN AND PELVIS WITH CONTRAST TECHNIQUE: Multidetector CT imaging of the abdomen and pelvis was performed using the standard protocol following bolus administration of intravenous contrast. CONTRAST:  OMNIPAQUE IOHEXOL 300 MG/ML  SOLN COMPARISON:  None. FINDINGS: Lower chest: Lung bases are clear. Heart is at the upper limits of normal in size. No pericardial or pleural effusion. Hepatobiliary: 1.4 cm well-circumscribed low-attenuation lesion in the left hepatic lobe is likely a cyst. Liver and gallbladder are otherwise unremarkable. No biliary ductal dilatation. Pancreas: Negative. Spleen: Negative. Adrenals/Urinary Tract: Adrenal glands and kidneys are unremarkable. Ureters are decompressed. Bladder is unremarkable. Stomach/Bowel: Stomach, small bowel, appendix and colon are unremarkable. Vascular/Lymphatic: Atherosclerotic calcification of the aorta. No pathologically enlarged lymph nodes. Reproductive: Uterus is visualized.  No adnexal mass. Other: No free fluid.  Mesenteries and peritoneum are unremarkable. Musculoskeletal: None. IMPRESSION: No findings to explain the patient's pain. Specifically, no evidence of diverticulitis. Electronically Signed   By: Leanna Battles M.D.   On: 12/28/2020 09:17    Procedures Procedures   Medications Ordered in ED Medications  ondansetron (ZOFRAN) injection 4 mg (4 mg Intravenous Given 12/28/20 0758)  morphine 4 MG/ML injection 4 mg (4 mg Intravenous Given 12/28/20 0802)  sodium chloride 0.9 % bolus 1,000 mL (1,000 mLs Intravenous New Bag/Given 12/28/20 0800)  iohexol (OMNIPAQUE) 300 MG/ML solution 100 mL (100 mLs Intravenous Contrast Given 12/28/20 4315)    ED Course  I have reviewed the triage vital signs and the nursing notes.  Pertinent labs & imaging results that were available during my care of the patient were reviewed by me and  considered in my medical decision making (see chart for details).    MDM Rules/Calculators/A&P  Patient presents to the ED with complaints of abdominal pain and diarrhea with some dark red blood in her stool. Patient nontoxic appearing, in no apparent distress, patient hypertensive on arrival but does appear uncomfortable, vitals otherwise unremarkable. On exam patient tender to palpation in the left lower quadrant and suprapubic region, no peritoneal signs. Will evaluate with labs and CT abdomen pelvis. Analgesics, anti-emetics, and fluids administered.   Ddx: Diverticulitis, colitis, gastroenteritis, foodborne diarrheal illness or other infectious diarrhea, diverticular bleed, upper GI bleed felt to be less likely given patients symptoms and appearance of stool, appendicitis, hemorrhoids, IBD, UTI  Additional history obtained:  Additional history obtained from chart review & nursing note review.   Lab Tests:  I Ordered, reviewed, and interpreted labs, which included:  CBC: No leukocytosis hemoglobin of 13.1 despite blood in stool, this is improved from prior labs CMP: Mild hyponatremia 134, CO2 19, glucose 133, no other significant electrolyte derangements, BUN is 29, cr WNL, normal LFTs Lipase: WNL UA: Small leukocytes noted, no other signs of infection patient without urinary symptoms Hemoccult: Positive  Imaging Studies ordered:  I ordered imaging studies which included CT abdomen pelvis, I independently reviewed, formal radiology impression shows:  No acute findings to explain patient's pain, no evidence of diverticulitis  ED Course:   RE-EVAL: After supportive treatment here in the ED patient is much more comfortable, and nausea is improved as well  On repeat abdominal exam patient remains without peritoneal signs, low suspicion for cholecystitis, pancreatitis, diverticulitis, appendicitis, bowel obstruction/perforation, or other acute surgical process.  Patient tolerating PO in the emergency department.  While Hemoccult is positive patient did not have gross visible blood or melena suspect small amount of blood in the stool in setting of likely diarrheal illness given the rest of patient's work-up has been reassuring.  Attempted to order GI pathogen panel but patient has not had further diarrhea.  Given symptoms have been occurring for less than 24 hours we will hold off on starting any antibiotics but have patient follow-up with PCP or return if diarrhea and blood in the stool is not improving within 48 hours. Will discharge home with supportive measures. I discussed results, treatment plan, need for PCP follow-up, and return precautions with the patient. Provided opportunity for questions, patient confirmed understanding and is in agreement with plan.    Portions of this note were generated with Scientist, clinical (histocompatibility and immunogenetics)Dragon dictation software. Dictation errors may occur despite best attempts at proofreading.  Final Clinical Impression(s) / ED Diagnoses Final diagnoses:  Diarrhea, unspecified type  Lower abdominal pain  Blood in stool    Rx / DC Orders ED Discharge Orders         Ordered    dicyclomine (BENTYL) 20 MG tablet  2 times daily        12/28/20 1122    ondansetron (ZOFRAN ODT) 4 MG disintegrating tablet        12/28/20 1122           Dartha LodgeFord, Lavon Horn N, [place]PA-C 12/28/20 1137    Melene PlanFloyd, Dan, DO 12/28/20 1516

## 2020-12-28 NOTE — ED Triage Notes (Signed)
Lower abdominal pain, nausea and diarrhea since last night. Pt reports now she is having dark red stool.

## 2020-12-28 NOTE — ED Notes (Signed)
Pt unable to provide stool at this time. Pt reported last BM was last nigh at 2300.

## 2020-12-28 NOTE — Discharge Instructions (Addendum)
Your evaluation today has overall been reassuring.  We do see some blood in your stool which her hemoglobin looks good and CT scan is reassuring.  May be viral GI illness or foodborne illness

## 2021-06-12 IMAGING — CT CT ABD-PELV W/ CM
2 of 5 series · 16 of 46 positions shown, 18 images · IV contrast (Omni 300)
Comparison: None.

CLINICAL DATA: Acute onset left-sided abdominal pain with nausea.

EXAM:
CT ABDOMEN AND PELVIS WITH CONTRAST
TECHNIQUE: Multidetector CT imaging of the abdomen and pelvis was performed
using the standard protocol following bolus administration of
intravenous contrast.
CONTRAST:  100mL OMNIPAQUE IOHEXOL 300 MG/ML  SOLN

[Series 3: a/p w/ 5mm · axial · 0.83mm/px · z∈[+828,+1243]mm · 13 of 95 slices shown, 15 images]
[im 6/95  soft-tissue]
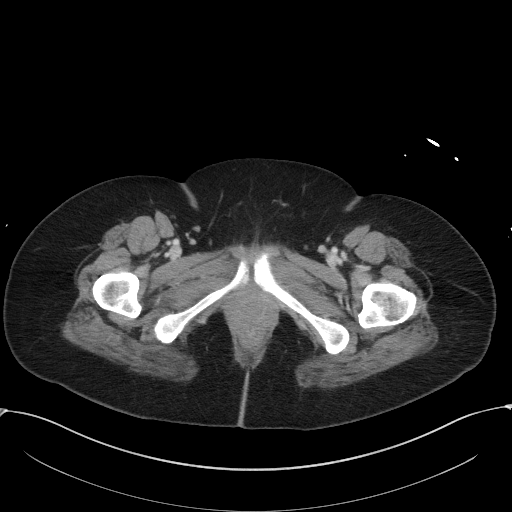
[im 6/95  bone]
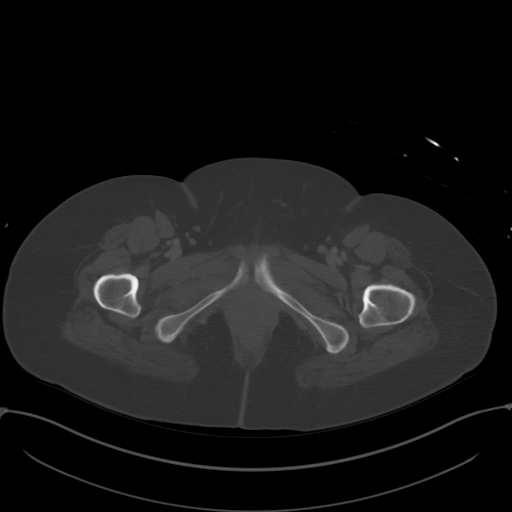
[im 11/95  soft-tissue]
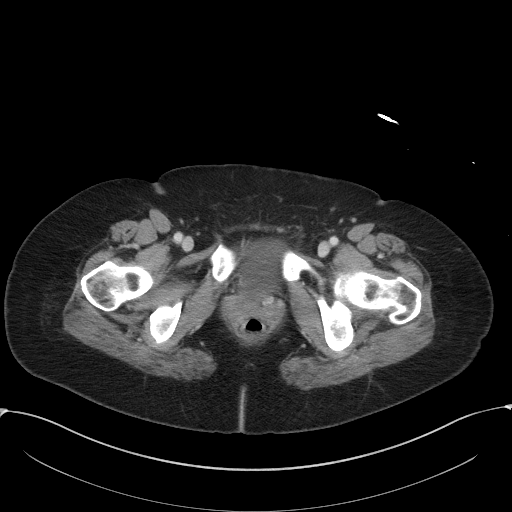
[im 21/95  soft-tissue]
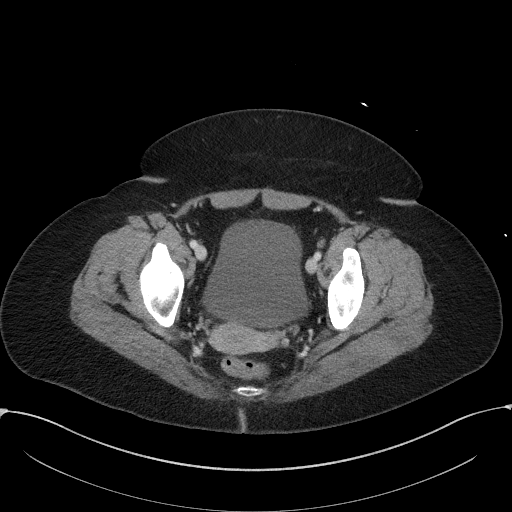
[im 27/95  soft-tissue]
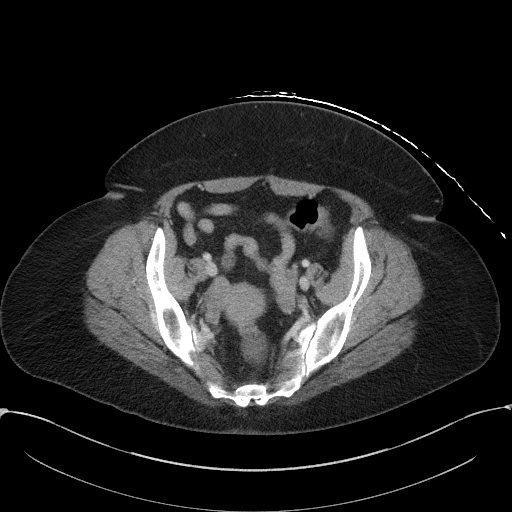
[im 32/95  soft-tissue]
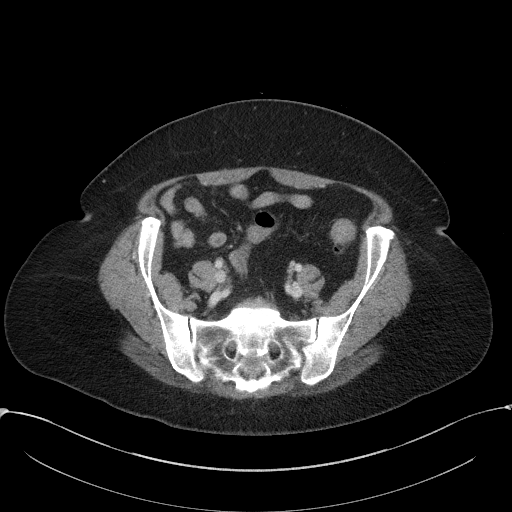
[im 42/95  soft-tissue]
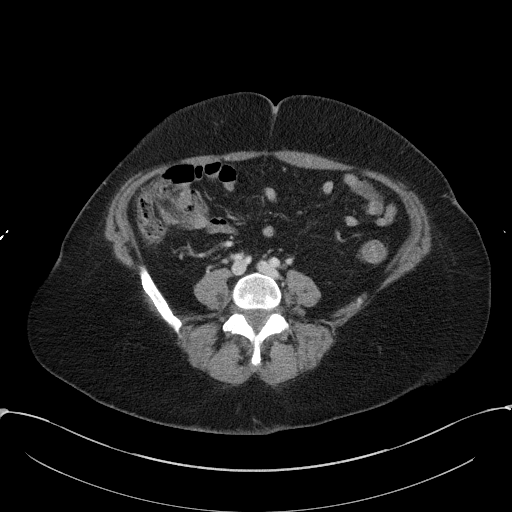
[im 48/95  soft-tissue]
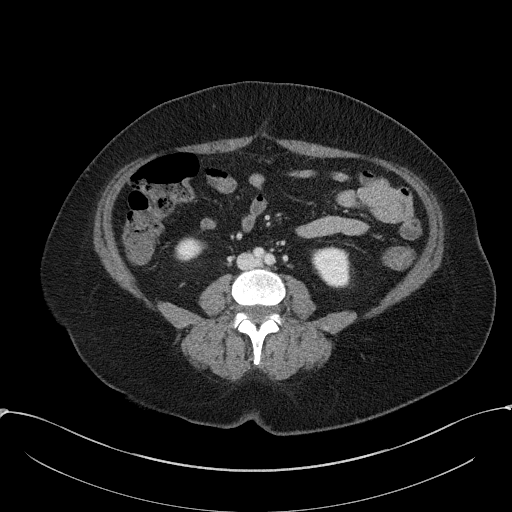
[im 53/95  soft-tissue]
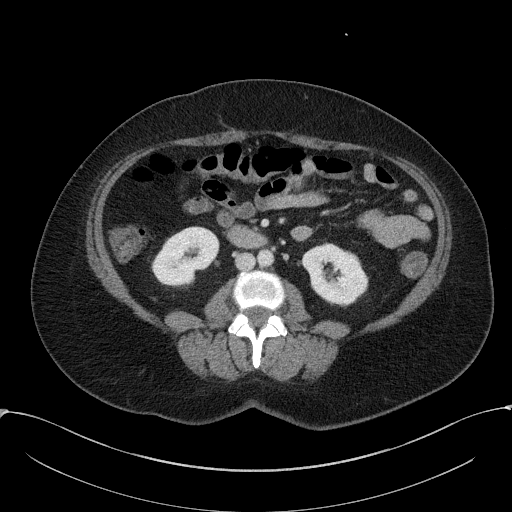
[im 63/95  soft-tissue]
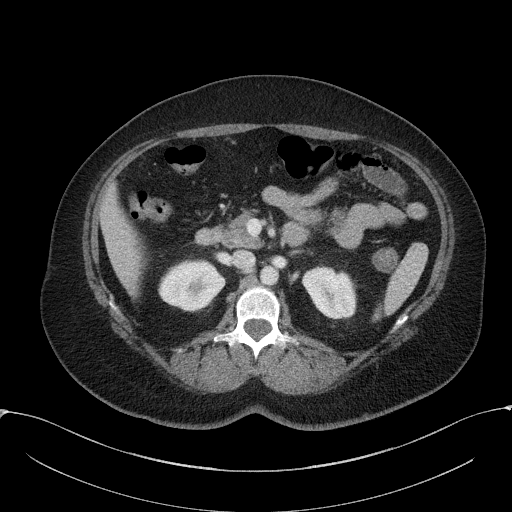
[im 63/95  bone]
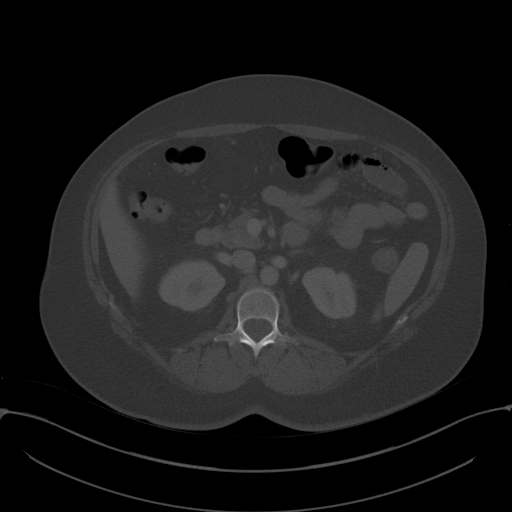
[im 68/95  soft-tissue]
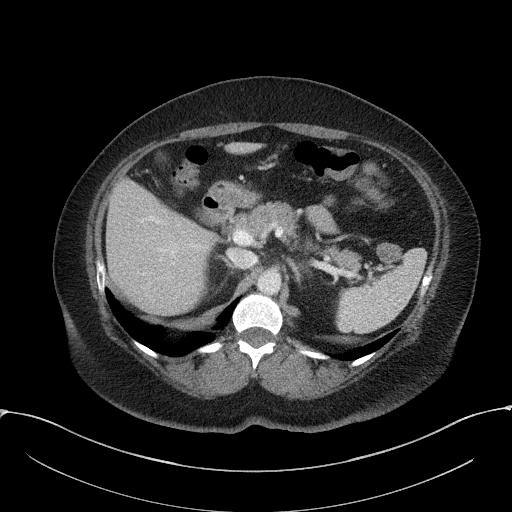
[im 74/95  soft-tissue]
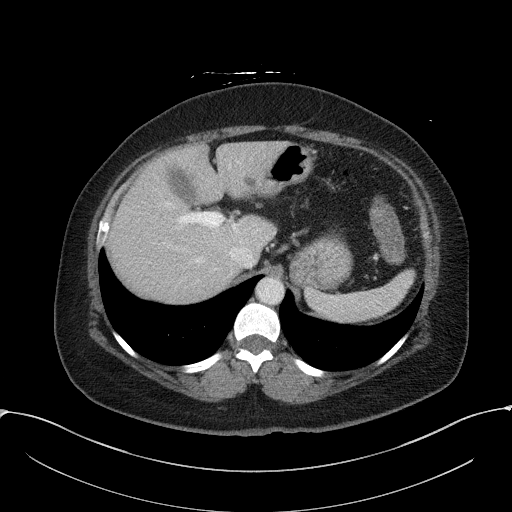
[im 84/95  soft-tissue]
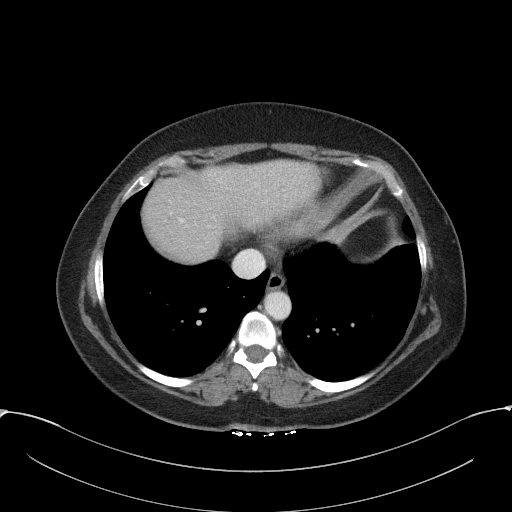
[im 89/95  soft-tissue]
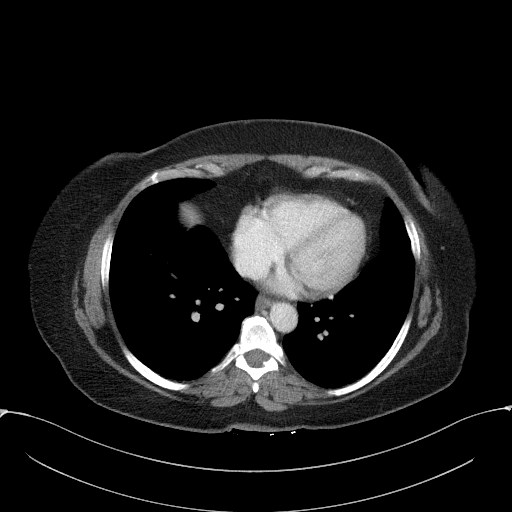

[Series 6: a/p w/ cor · coronal · 0.79mm/px · 3 of 126 slices shown]
[im 42/126  soft-tissue]
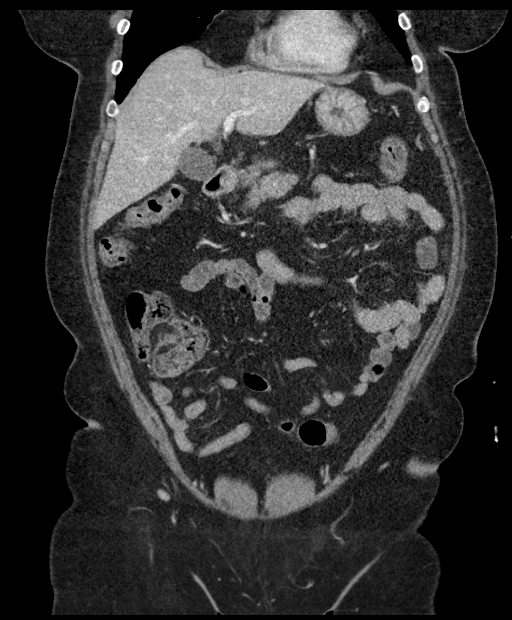
[im 56/126  soft-tissue]
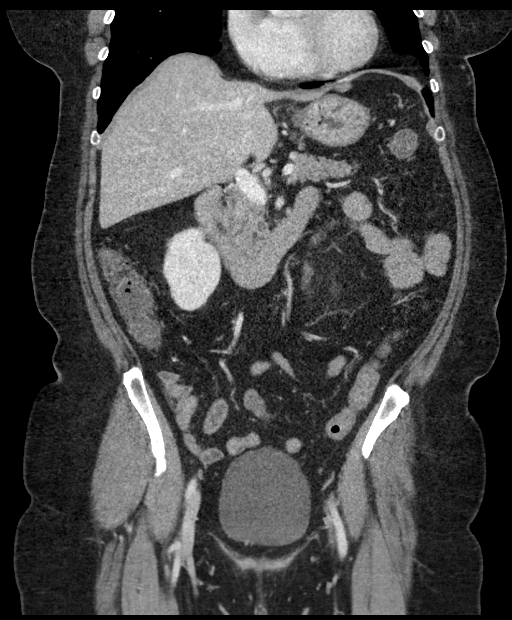
[im 70/126  soft-tissue]
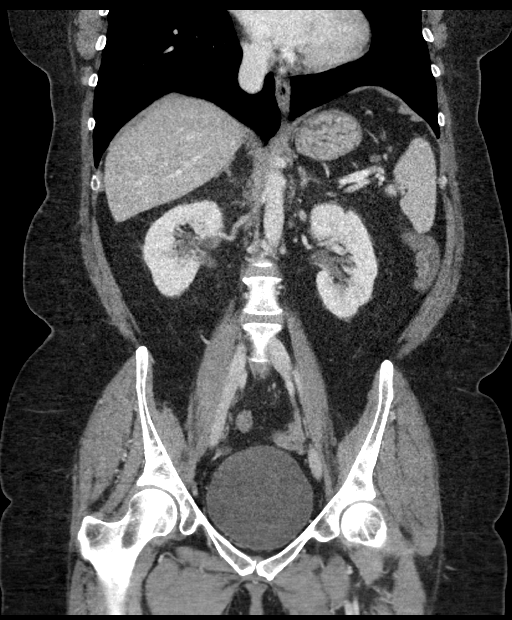

[16 of 46 positions shown; findings below may reference images not displayed]

FINDINGS: Lower chest: Lung bases are clear. Heart is at the upper limits of
normal in size. No pericardial or pleural effusion.

Hepatobiliary: 1.4 cm well-circumscribed low-attenuation lesion in
the left hepatic lobe is likely a cyst. Liver and gallbladder are
otherwise unremarkable. No biliary ductal dilatation.

Pancreas: Negative.

Spleen: Negative.

Adrenals/Urinary Tract: Adrenal glands and kidneys are unremarkable.
Ureters are decompressed. Bladder is unremarkable.

Stomach/Bowel: Stomach, small bowel, appendix and colon are
unremarkable.

Vascular/Lymphatic: Atherosclerotic calcification of the aorta. No
pathologically enlarged lymph nodes.

Reproductive: Uterus is visualized.  No adnexal mass.

Other: No free fluid.  Mesenteries and peritoneum are unremarkable.

Musculoskeletal: None.
IMPRESSION: No findings to explain the patient's pain. Specifically, no evidence
of diverticulitis.

## 2023-10-25 ENCOUNTER — Other Ambulatory Visit: Payer: Self-pay | Admitting: Internal Medicine

## 2023-10-25 DIAGNOSIS — Z1231 Encounter for screening mammogram for malignant neoplasm of breast: Secondary | ICD-10-CM

## 2024-03-18 ENCOUNTER — Emergency Department (HOSPITAL_COMMUNITY)
Admission: EM | Admit: 2024-03-18 | Discharge: 2024-03-18 | Disposition: A | Discharge status: Home / Self Care | Attending: Emergency Medicine | Admitting: Emergency Medicine

## 2024-03-18 ENCOUNTER — Encounter (HOSPITAL_COMMUNITY): Payer: Self-pay

## 2024-03-18 ENCOUNTER — Emergency Department (HOSPITAL_COMMUNITY)

## 2024-03-18 ENCOUNTER — Other Ambulatory Visit: Payer: Self-pay

## 2024-03-18 DIAGNOSIS — I10 Essential (primary) hypertension: Secondary | ICD-10-CM | POA: Insufficient documentation

## 2024-03-18 DIAGNOSIS — R0602 Shortness of breath: Secondary | ICD-10-CM | POA: Insufficient documentation

## 2024-03-18 DIAGNOSIS — E871 Hypo-osmolality and hyponatremia: Secondary | ICD-10-CM | POA: Insufficient documentation

## 2024-03-18 DIAGNOSIS — E860 Dehydration: Secondary | ICD-10-CM | POA: Insufficient documentation

## 2024-03-18 LAB — CBC
HCT: 38.8 % (ref 36.0–46.0)
Hemoglobin: 13.5 g/dL (ref 12.0–15.0)
MCH: 30.5 pg (ref 26.0–34.0)
MCHC: 34.8 g/dL (ref 30.0–36.0)
MCV: 87.8 fL (ref 80.0–100.0)
Platelets: 417 K/uL — ABNORMAL HIGH (ref 150–400)
RBC: 4.42 MIL/uL (ref 3.87–5.11)
RDW: 12 % (ref 11.5–15.5)
WBC: 8.6 K/uL (ref 4.0–10.5)
nRBC: 0 % (ref 0.0–0.2)

## 2024-03-18 LAB — COMPREHENSIVE METABOLIC PANEL WITH GFR
ALT: 16 U/L (ref 0–44)
AST: 21 U/L (ref 15–41)
Albumin: 4.2 g/dL (ref 3.5–5.0)
Alkaline Phosphatase: 65 U/L (ref 38–126)
Anion gap: 15 (ref 5–15)
BUN: 33 mg/dL — ABNORMAL HIGH (ref 6–20)
CO2: 24 mmol/L (ref 22–32)
Calcium: 9.7 mg/dL (ref 8.9–10.3)
Chloride: 94 mmol/L — ABNORMAL LOW (ref 98–111)
Creatinine, Ser: 1.06 mg/dL — ABNORMAL HIGH (ref 0.44–1.00)
GFR, Estimated: >60 mL/min (ref >60)
Glucose, Bld: 104 mg/dL — ABNORMAL HIGH (ref 70–99)
Potassium: 3.7 mmol/L (ref 3.5–5.1)
Sodium: 133 mmol/L — ABNORMAL LOW (ref 135–145)
Total Bilirubin: 1 mg/dL (ref 0.0–1.2)
Total Protein: 7.8 g/dL (ref 6.5–8.1)

## 2024-03-18 LAB — TROPONIN I (HIGH SENSITIVITY)
Troponin I (High Sensitivity): 3 ng/L (ref <18)
Troponin I (High Sensitivity): 4 ng/L (ref <18)

## 2024-03-18 LAB — BRAIN NATRIURETIC PEPTIDE: B Natriuretic Peptide: 3.7 pg/mL (ref 0.0–100.0)

## 2024-03-18 LAB — D-DIMER, QUANTITATIVE: D-Dimer, Quant: <0.27 ug{FEU}/mL (ref 0.00–0.50)

## 2024-03-18 MED ORDER — SODIUM CHLORIDE 0.9 % IV BOLUS
1000.0000 mL | Freq: Once | INTRAVENOUS | Status: AC
  Administered 2024-03-18: 1000 mL via INTRAVENOUS

## 2024-03-18 MED ORDER — ALBUTEROL SULFATE HFA 108 (90 BASE) MCG/ACT IN AERS: 1.0000 | INHALATION_SPRAY | Freq: Four times a day (QID) | RESPIRATORY_TRACT | 0 refills | Status: AC | PRN

## 2024-03-18 MED ORDER — IPRATROPIUM-ALBUTEROL 0.5-2.5 (3) MG/3ML IN SOLN
3.0000 mL | Freq: Once | RESPIRATORY_TRACT | Status: AC
  Administered 2024-03-18: 3 mL via RESPIRATORY_TRACT
  Filled 2024-03-18: qty 3

## 2024-03-18 NOTE — ED Provider Notes (Signed)
 [facility] AT [facility] Provider Note   CSN: [id number] Arrival date & time: 03/18/24  9142     Patient presents with: SOB and Dizziness   [name] is a 58 y.o. female.  Patient with past history significant for hypertension, hyperlipidemia, hot flashes due to menopause, obesity who presents ED with concerns of shortness of breath and dizziness.  Reports has been ongoing for the last several days and worsens primarily with standing.  States that she is currently on phentermine and has been on this medication for less than 1 months.  Denies any recent changes to any of her home medications but does report starting a combination lisinopril /hydrochlorothiazide about 1 week ago.  Denies any syncopal episodes.  No reported headache.  Does endorse a history of asthmatic bronchitis.  Does not use inhalers at home.   Dizziness      Prior to Admission medications   Medication Sig Start Date End Date Taking? Authorizing Provider  albuterol  (VENTOLIN  HFA) 108 (90 Base) MCG/ACT inhaler Inhale 1-2 puffs into the lungs every 6 (six) hours as needed for wheezing or shortness of breath. 03/18/24  Yes Kuulei Kleier A, PA-C  benzonatate  (TESSALON ) 200 MG capsule Take 1 capsule (200 mg total) by mouth 3 (three) times daily as needed for cough. Patient not taking: Reported on 04/16/2020 01/05/20   Raenelle Donalda HERO, MD  clonazePAM  (KLONOPIN ) 0.25 MG disintegrating tablet Take 1 tablet (0.25 mg total) by mouth 2 (two) times daily as needed (anxiety). Patient not taking: Reported on 04/16/2020 01/05/20   Raenelle Donalda HERO, MD  dexamethasone  (DECADRON ) 6 MG tablet Take 1 tablet (6 mg total) by mouth daily. Patient not taking: Reported on 04/16/2020 01/05/20   Raenelle Donalda HERO, MD  dicyclomine  (BENTYL ) 20 MG tablet Take 1 tablet (20 mg total) by mouth 2 (two) times daily. 12/28/20   Kehrli, Kelsey F, PA-C  fluticasone  (FLONASE ) 50 MCG/ACT nasal spray Place 1 spray into  both nostrils daily. Patient not taking: Reported on 04/16/2020 01/05/20 01/04/21  Raenelle Donalda HERO, MD  ibuprofen  (ADVIL ) 200 MG tablet Take 600-800 mg by mouth every 6 (six) hours as needed for headache or mild pain.    [provider]  ondansetron  (ZOFRAN  ODT) 4 MG disintegrating tablet 4mg  ODT q4 hours prn nausea/vomit 12/28/20   Kehrli, Kelsey F, PA-C  oxymetazoline  (AFRIN) 0.05 % nasal spray Place 2 sprays into both nostrils 2 (two) times daily as needed for congestion (epistaxis). Patient not taking: Reported on 04/16/2020 01/05/20   Raenelle Donalda HERO, MD    Allergies: Lisinopril  and Milk-related compounds    Review of Systems  Neurological:  Positive for dizziness.    Updated Vital Signs BP (!) 135/99   Pulse 95   Temp 98.2 F (36.8 C) (Oral)   Resp 16   Ht 5' 2 (1.575 m)   Wt 69.9 kg   LMP 06/26/2013   SpO2 100%   BMI 28.17 kg/m   Physical Exam Vitals and nursing note reviewed.  Constitutional:      General: She is not in acute distress.    Appearance: She is well-developed.  HENT:     Head: Normocephalic and atraumatic.  Eyes:     Conjunctiva/sclera: Conjunctivae normal.  Cardiovascular:     Rate and Rhythm: Normal rate and regular rhythm.     Heart sounds: No murmur heard. Pulmonary:     Effort: Pulmonary effort is normal. No respiratory distress.     Breath sounds:  Normal breath sounds. No wheezing or rales.  Abdominal:     Palpations: Abdomen is soft.     Tenderness: There is no abdominal tenderness.  Musculoskeletal:        General: No swelling.     Cervical back: Neck supple.     Right lower leg: No edema.     Left lower leg: No edema.  Skin:    General: Skin is warm and dry.     Capillary Refill: Capillary refill takes less than 2 seconds.  Neurological:     General: No focal deficit present.     Mental Status: She is alert. Mental status is at baseline.     Motor: No weakness.  Psychiatric:        Mood and Affect: Mood normal.      (all labs ordered are listed, but only abnormal results are displayed) Labs Reviewed  CBC - Abnormal; Notable for the following components:      Result Value   Platelets 417 (*)    All other components within normal limits  COMPREHENSIVE METABOLIC PANEL WITH GFR - Abnormal; Notable for the following components:   Sodium 133 (*)    Chloride 94 (*)    Glucose, Bld 104 (*)    BUN 33 (*)    Creatinine, Ser 1.06 (*)    All other components within normal limits  D-DIMER, QUANTITATIVE  BRAIN NATRIURETIC PEPTIDE  TROPONIN I (HIGH SENSITIVITY)  TROPONIN I (HIGH SENSITIVITY)    EKG: EKG Interpretation Date/Time:  Monday March 18 2024 09:09:52 EDT Ventricular Rate:  108 PR Interval:  124 QRS Duration:  88 QT Interval:  340 QTC Calculation: 455 R Axis:   27  Text Interpretation: Sinus tachycardia T wave abnormality, consider inferior ischemia When compared with ECG of 16-Apr-2020 03:44, PREVIOUS ECG IS PRESENT Confirmed by Doretha Folks ([id number]) on 03/18/2024 9:36:05 AM  Radiology: [facility] Chest 2 View Result Date: 03/18/2024 CLINICAL DATA:  Shortness of breath. EXAM: CHEST - 2 VIEW COMPARISON:  April 16, 2020. FINDINGS: The heart size and mediastinal contours are within normal limits. Both lungs are clear. The visualized skeletal structures are unremarkable. IMPRESSION: No active cardiopulmonary disease. Electronically Signed   By: Lynwood Landy Raddle M.D.   On: 03/18/2024 10:06     Procedures   Medications Ordered in the ED  sodium chloride  0.9 % bolus 1,000 mL (0 mLs Intravenous Stopped 03/18/24 1218)  ipratropium-albuterol  (DUONEB) 0.5-2.5 (3) MG/3ML nebulizer solution 3 mL (3 mLs Nebulization Given 03/18/24 1118)                                    Medical Decision Making Amount and/or Complexity of Data Reviewed Labs: ordered. Radiology: ordered.  Risk Prescription drug management.   This patient presents to the ED for concern of shortness of breath, dizziness, this  involves an extensive number of treatment options, and is a complaint that carries with it a high risk of complications and morbidity.  The differential diagnosis includes pneumonia, bronchitis, asthma, CHF   Co morbidities that complicate the patient evaluation  Hypertension, obesity, hyperlipidemia   Lab Tests:  I Ordered, and personally interpreted labs.  The pertinent results include: CBC unremarkable, CMP with mild hyponatremia at 133 and hypochloremia 94 and BUN elevated at 33 with creatinine up to 1.06, indicating AKI, D-dimer negative at less than 0.27, BNP negative at 3.7, troponin negative at 4.   Imaging  Studies ordered:  I ordered imaging studies including chest x-ray I independently visualized and interpreted imaging which showed negative for any acute cardiopulmonary process I agree with the radiologist interpretation   Cardiac Monitoring: / EKG:  The patient was maintained on a cardiac monitor.  I personally viewed and interpreted the cardiac monitored which showed an underlying rhythm of: Sinus tachycardia   Consultations Obtained:  I requested consultation with none,  and discussed lab and imaging findings as well as pertinent plan - they recommend: N/A   Problem List / ED Course / Critical interventions / Medication management  Patient with past history significant for hypertension, hyperlipidemia, hot flashes due to menopause presents ED with concerns of dizziness and shortness of breath.  Reports that this has been ongoing over the last several days and typically worsens with standing.  She currently takes phentermine for weight loss and is concerned for possible contribution to her current symptoms.  Also reports that she was started on combination of lisinopril  and hydrochlorothiazide about 1 week ago.  Again, she reports majority of her symptoms develop when she tries to stand.  Clinically, she is mildly dehydrated with slightly delayed capillary refill at about  2 to 3 seconds and low-grade tachycardia in the low 100s.  Fluid bolus initiated. Physical exam is unremarkable without any acute findings to suggest cardiopulmonary process.  No appreciable heart murmur or abnormal lung sounds.  No lower extremity swelling or edema.  She is not currently on blood thinners and per history of PE or DVT. Lab workup is unremarkable.  Mild dehydration with findings on CMP.  Duration addressed with fluid bolus.  DuoNeb given as attempt at management of breathing given she reports history of asthmatic bronchitis. After medication ministration, patient's tachycardia resolved.  She reports improved work of breathing no longer endorsing shortness of breath.  Advised patient to follow-up with PCP regarding continuation of her phentermine as well as her blood pressure medications.  Given that she was not hypotensive here in the emergency department, advised patient to continue with her current medication that prescribed. I ordered medication including fluids, DuoNeb for dehydration, shortness of breath Reevaluation of the patient after these medicines showed that the patient improved I have reviewed the patients home medicines and have made adjustments as needed    Test / Admission - Considered:  Stable for outpatient follow-up.  Final diagnoses:  Shortness of breath  Dehydration    ED Discharge Orders          Ordered    albuterol  (VENTOLIN  HFA) 108 (90 Base) MCG/ACT inhaler  Every 6 hours PRN        03/18/24 1405               Tawan Degroote A, PA-C 03/18/24 1521    Freddi Hamilton, MD 03/22/24 1501

## 2024-03-18 NOTE — ED Triage Notes (Addendum)
 Pt BIB [facility] from work where she reports having daily moments of dizziness & SOB. Pt reports she is worried her med Phentermine is causing since sahe has been taking it the last 4 months but her PCP told her to keep taking it. EMS reports her V/S @ 150/100, CBG 123, 100 bpm, 12L good, & she was cool/clammy, A/Ox4.

## 2024-03-18 NOTE — ED Provider Triage Note (Signed)
 Emergency Medicine Provider Triage Evaluation Note  [name] , a 58 y.o. female  was evaluated in triage.  Pt complains of feeling short of breath and lightheaded.  She reports now with even 2 or 3 steps she is feeling short of breath.  Occasional coughing and wheezing with a history of asthma but no longer has a pump.  Also reports occasional chest pain.  Feels that it may be from the phentermine she started 4 months ago.  Review of Systems  Positive: Dizziness, dyspnea on exertion, cough, tingling in arms and legs Negative: Fever, productive cough, abdominal pain  Physical Exam  BP (!) 133/93   Pulse (!) 105   Temp 98.3 F (36.8 C)   Resp 18   Ht 5' 2 (1.575 m)   Wt 69.9 kg   LMP 06/26/2013   SpO2 100%   BMI 28.17 kg/m  Gen:   Awake, no distress   Resp:  Normal effort, breath sounds are clear MSK:   Moves extremities without difficulty  Other:  No abdominal pain.  Mild tachycardia.  Medical Decision Making  Medically screening exam initiated at 9:36 AM.  Appropriate orders placed.  Naveena Siniyah Evangelist was informed that the remainder of the evaluation will be completed by another provider, this initial triage assessment does not replace that evaluation, and the importance of remaining in the ED until their evaluation is complete.     Doretha Folks, MD 03/18/24 [phone]

## 2024-03-18 NOTE — ED Notes (Signed)
 Patient discharged by RN, ambulatory to lobby at time of dc.

## 2024-03-18 NOTE — Discharge Instructions (Signed)
 You are seen in the emergency department today for concerns of shortness of breath and dizziness.  Your labs and imaging were thankfully reassuring but you did appear to be slightly dehydrated.  You had improvement in your work of breathing after administration of a breathing treatment.  Please discuss with your primary care provider continuing your medications as currently prescribed.  I am not making changes to your medications today.  I have sent in a prescription for an inhaler to your pharmacy.  Please use this as prescribed.  For any concerns of new or worsening symptoms, return to the emergency department.
